# Patient Record
Sex: Female | Born: 1935 | Race: White | Hispanic: No | Smoking: Never smoker
Health system: Southern US, Community
[De-identification: ages and names within clinical notes are randomized; demographics above are authoritative.]

## PROBLEM LIST (undated history)

## (undated) DIAGNOSIS — K5790 Diverticulosis of intestine, part unspecified, without perforation or abscess without bleeding: Secondary | ICD-10-CM

## (undated) DIAGNOSIS — C801 Malignant (primary) neoplasm, unspecified: Secondary | ICD-10-CM

## (undated) DIAGNOSIS — J45909 Unspecified asthma, uncomplicated: Secondary | ICD-10-CM

## (undated) DIAGNOSIS — G43909 Migraine, unspecified, not intractable, without status migrainosus: Secondary | ICD-10-CM

## (undated) DIAGNOSIS — K589 Irritable bowel syndrome without diarrhea: Secondary | ICD-10-CM

## (undated) DIAGNOSIS — N319 Neuromuscular dysfunction of bladder, unspecified: Secondary | ICD-10-CM

## (undated) DIAGNOSIS — M1990 Unspecified osteoarthritis, unspecified site: Secondary | ICD-10-CM

## (undated) DIAGNOSIS — E785 Hyperlipidemia, unspecified: Secondary | ICD-10-CM

## (undated) DIAGNOSIS — I10 Essential (primary) hypertension: Secondary | ICD-10-CM

## (undated) DIAGNOSIS — I714 Abdominal aortic aneurysm, without rupture, unspecified: Secondary | ICD-10-CM

## (undated) DIAGNOSIS — I82409 Acute embolism and thrombosis of unspecified deep veins of unspecified lower extremity: Secondary | ICD-10-CM

## (undated) DIAGNOSIS — K219 Gastro-esophageal reflux disease without esophagitis: Secondary | ICD-10-CM

## (undated) HISTORY — PX: ABDOMINAL HYSTERECTOMY: SHX81

## (undated) HISTORY — PX: CHOLECYSTECTOMY: SHX55

## (undated) HISTORY — DX: Migraine, unspecified, not intractable, without status migrainosus: G43.909

## (undated) HISTORY — PX: BREAST SURGERY: SHX581

## (undated) HISTORY — DX: Neuromuscular dysfunction of bladder, unspecified: N31.9

## (undated) HISTORY — DX: Hyperlipidemia, unspecified: E78.5

## (undated) HISTORY — DX: Irritable bowel syndrome, unspecified: K58.9

## (undated) HISTORY — DX: Diverticulosis of intestine, part unspecified, without perforation or abscess without bleeding: K57.90

## (undated) HISTORY — DX: Abdominal aortic aneurysm, without rupture, unspecified: I71.40

## (undated) HISTORY — DX: Acute embolism and thrombosis of unspecified deep veins of unspecified lower extremity: I82.409

## (undated) HISTORY — PX: APPENDECTOMY: SHX54

---

## 1968-08-23 HISTORY — PX: BREAST EXCISIONAL BIOPSY: SUR124

## 1998-08-23 HISTORY — PX: BREAST LUMPECTOMY: SHX2

## 2004-07-06 ENCOUNTER — Ambulatory Visit: Payer: Self-pay | Admitting: Radiation Oncology

## 2004-09-23 ENCOUNTER — Ambulatory Visit: Payer: Self-pay

## 2004-09-24 ENCOUNTER — Ambulatory Visit: Payer: Self-pay

## 2004-12-16 ENCOUNTER — Ambulatory Visit: Payer: Self-pay | Admitting: Internal Medicine

## 2005-12-17 ENCOUNTER — Ambulatory Visit: Payer: Self-pay | Admitting: Internal Medicine

## 2006-04-26 ENCOUNTER — Inpatient Hospital Stay: Payer: Self-pay | Admitting: Internal Medicine

## 2006-06-22 ENCOUNTER — Ambulatory Visit: Payer: Self-pay | Admitting: Internal Medicine

## 2006-08-30 ENCOUNTER — Ambulatory Visit: Payer: Self-pay | Admitting: Internal Medicine

## 2006-12-20 ENCOUNTER — Ambulatory Visit: Payer: Self-pay | Admitting: Internal Medicine

## 2007-06-13 ENCOUNTER — Ambulatory Visit: Payer: Self-pay

## 2007-12-21 ENCOUNTER — Ambulatory Visit: Payer: Self-pay | Admitting: Internal Medicine

## 2008-02-05 ENCOUNTER — Observation Stay: Payer: Self-pay | Admitting: Internal Medicine

## 2008-02-07 ENCOUNTER — Ambulatory Visit: Payer: Self-pay | Admitting: Gastroenterology

## 2008-03-07 ENCOUNTER — Ambulatory Visit: Payer: Self-pay | Admitting: Gastroenterology

## 2008-04-02 ENCOUNTER — Ambulatory Visit: Payer: Self-pay | Admitting: Gastroenterology

## 2008-05-07 ENCOUNTER — Ambulatory Visit: Payer: Self-pay | Admitting: Internal Medicine

## 2008-05-29 ENCOUNTER — Ambulatory Visit: Payer: Self-pay | Admitting: Surgery

## 2008-09-03 ENCOUNTER — Ambulatory Visit: Payer: Self-pay | Admitting: Surgery

## 2008-10-09 ENCOUNTER — Inpatient Hospital Stay: Payer: Self-pay | Admitting: Surgery

## 2008-12-23 ENCOUNTER — Ambulatory Visit: Payer: Self-pay | Admitting: Internal Medicine

## 2009-04-15 ENCOUNTER — Ambulatory Visit: Payer: Self-pay | Admitting: Internal Medicine

## 2009-05-16 ENCOUNTER — Ambulatory Visit: Payer: Self-pay | Admitting: Internal Medicine

## 2009-07-24 ENCOUNTER — Ambulatory Visit: Payer: Self-pay | Admitting: Gastroenterology

## 2009-10-02 ENCOUNTER — Ambulatory Visit: Payer: Self-pay | Admitting: Cardiology

## 2009-11-06 ENCOUNTER — Ambulatory Visit: Payer: Self-pay | Admitting: Internal Medicine

## 2009-12-24 ENCOUNTER — Ambulatory Visit: Payer: Self-pay | Admitting: Internal Medicine

## 2010-06-24 ENCOUNTER — Ambulatory Visit: Payer: Self-pay | Admitting: Internal Medicine

## 2011-01-05 ENCOUNTER — Ambulatory Visit: Payer: Self-pay | Admitting: Internal Medicine

## 2011-12-13 ENCOUNTER — Ambulatory Visit: Payer: Self-pay | Admitting: Internal Medicine

## 2012-07-28 ENCOUNTER — Ambulatory Visit: Payer: Self-pay | Admitting: Vascular Surgery

## 2012-09-23 ENCOUNTER — Emergency Department: Payer: Self-pay | Admitting: Emergency Medicine

## 2012-11-10 ENCOUNTER — Ambulatory Visit: Payer: Self-pay | Admitting: Orthopedic Surgery

## 2013-03-22 ENCOUNTER — Ambulatory Visit: Payer: Self-pay | Admitting: Internal Medicine

## 2013-12-26 ENCOUNTER — Ambulatory Visit (INDEPENDENT_AMBULATORY_CARE_PROVIDER_SITE_OTHER): Payer: Medicare Other | Admitting: Podiatry

## 2013-12-26 ENCOUNTER — Encounter: Payer: Self-pay | Admitting: Podiatry

## 2013-12-26 ENCOUNTER — Ambulatory Visit (INDEPENDENT_AMBULATORY_CARE_PROVIDER_SITE_OTHER): Payer: Medicare Other

## 2013-12-26 VITALS — BP 121/56 | HR 73 | Resp 16 | Ht 62.0 in | Wt 142.0 lb

## 2013-12-26 DIAGNOSIS — M79673 Pain in unspecified foot: Secondary | ICD-10-CM

## 2013-12-26 DIAGNOSIS — M7751 Other enthesopathy of right foot: Secondary | ICD-10-CM

## 2013-12-26 DIAGNOSIS — M2040 Other hammer toe(s) (acquired), unspecified foot: Secondary | ICD-10-CM

## 2013-12-26 DIAGNOSIS — M79609 Pain in unspecified limb: Secondary | ICD-10-CM

## 2013-12-26 DIAGNOSIS — M7750 Other enthesopathy of unspecified foot: Secondary | ICD-10-CM

## 2013-12-26 NOTE — Progress Notes (Signed)
   Subjective:    Patient ID: Traci Quinn, female    DOB: [birth year], 80 y.o.   MRN: [id number]  HPI Comments: i have a corn on my 2nd toe and its tender. It swells up. i have had it for 3 months. Its about the same but some days its worse. i used a corn patch for 14 days.  Foot Pain Associated symptoms include abdominal pain and fatigue.      Review of Systems  Constitutional: Positive for fatigue.  Respiratory: Positive for wheezing.   Gastrointestinal: Positive for abdominal pain.  Hematological: Bruises/bleeds easily.  All other systems reviewed and are negative.      Objective:   Physical Exam: I have reviewed past medical history medications allergies surgeries social history and review of systems. Pulses are palpable bilateral foot. Neurologic sensorium is intact bilateral. Degenerative flexor intact bilateral. Orthopedic evaluation demonstrates hallux abductovalgus deformity right foot with hallux rubbing the second toe he is a hammertoe deformity. This hammertoe deformity. The arthritic in nature. She also has a area that is painful and swollen overlying the DIPJ medially with the area of reactive hyperkeratosis. Her margin of her toenail appears to be ingrown as well.        Assessment & Plan:  Assessment: Hammertoe deformity with bursitis and capsulitis. Mild paronychia second digit right foot.  Plan: Injected the second toe with dexamethasone and local anesthetic debridement of reactive hyperkeratosis. Also debrided the nail. Suggested she start soaking in Epsom salts warm water for mild paronychia and she has. I will followup with her as needed with and we did placed padding.

## 2014-02-23 DIAGNOSIS — E782 Mixed hyperlipidemia: Secondary | ICD-10-CM | POA: Insufficient documentation

## 2014-02-23 DIAGNOSIS — N1832 Chronic kidney disease, stage 3b: Secondary | ICD-10-CM | POA: Insufficient documentation

## 2014-02-23 DIAGNOSIS — J45909 Unspecified asthma, uncomplicated: Secondary | ICD-10-CM | POA: Insufficient documentation

## 2014-02-23 DIAGNOSIS — G8929 Other chronic pain: Secondary | ICD-10-CM | POA: Insufficient documentation

## 2014-02-23 DIAGNOSIS — G43909 Migraine, unspecified, not intractable, without status migrainosus: Secondary | ICD-10-CM | POA: Insufficient documentation

## 2014-02-23 DIAGNOSIS — K219 Gastro-esophageal reflux disease without esophagitis: Secondary | ICD-10-CM | POA: Insufficient documentation

## 2014-02-23 DIAGNOSIS — I728 Aneurysm of other specified arteries: Secondary | ICD-10-CM | POA: Insufficient documentation

## 2014-03-08 DIAGNOSIS — D510 Vitamin B12 deficiency anemia due to intrinsic factor deficiency: Secondary | ICD-10-CM | POA: Insufficient documentation

## 2014-04-04 ENCOUNTER — Ambulatory Visit: Payer: Self-pay | Admitting: Internal Medicine

## 2016-04-20 ENCOUNTER — Other Ambulatory Visit: Payer: Self-pay | Admitting: Gastroenterology

## 2016-04-20 DIAGNOSIS — R1310 Dysphagia, unspecified: Secondary | ICD-10-CM

## 2016-04-23 ENCOUNTER — Ambulatory Visit
Admission: RE | Admit: 2016-04-23 | Discharge: 2016-04-23 | Disposition: A | Discharge status: Home / Self Care | Payer: Medicare Other | Source: Ambulatory Visit | Attending: Gastroenterology | Admitting: Gastroenterology

## 2016-04-23 DIAGNOSIS — R1310 Dysphagia, unspecified: Secondary | ICD-10-CM | POA: Diagnosis present

## 2016-04-23 DIAGNOSIS — K219 Gastro-esophageal reflux disease without esophagitis: Secondary | ICD-10-CM | POA: Diagnosis not present

## 2016-04-23 DIAGNOSIS — K228 Other specified diseases of esophagus: Secondary | ICD-10-CM | POA: Insufficient documentation

## 2016-04-23 DIAGNOSIS — K222 Esophageal obstruction: Secondary | ICD-10-CM | POA: Diagnosis not present

## 2016-05-01 ENCOUNTER — Other Ambulatory Visit: Payer: Self-pay | Admitting: Gastroenterology

## 2016-05-01 DIAGNOSIS — Z800 Family history of malignant neoplasm of digestive organs: Secondary | ICD-10-CM

## 2016-05-12 ENCOUNTER — Ambulatory Visit
Admission: RE | Admit: 2016-05-12 | Discharge: 2016-05-12 | Disposition: A | Discharge status: Home / Self Care | Payer: Medicare Other | Source: Ambulatory Visit | Attending: Gastroenterology | Admitting: Gastroenterology

## 2016-05-12 DIAGNOSIS — Z800 Family history of malignant neoplasm of digestive organs: Secondary | ICD-10-CM

## 2016-08-06 ENCOUNTER — Ambulatory Visit: Payer: Medicare Other | Admitting: Anesthesiology

## 2016-08-06 ENCOUNTER — Encounter: Payer: Self-pay | Admitting: *Deleted

## 2016-08-06 ENCOUNTER — Encounter
Admission: RE | Disposition: A | Discharge status: Home / Self Care | Payer: Self-pay | Source: Ambulatory Visit | Attending: Gastroenterology

## 2016-08-06 ENCOUNTER — Ambulatory Visit
Admission: RE | Admit: 2016-08-06 | Discharge: 2016-08-06 | Disposition: A | Discharge status: Home / Self Care | Payer: Medicare Other | Source: Ambulatory Visit | Attending: Gastroenterology | Admitting: Gastroenterology

## 2016-08-06 DIAGNOSIS — R1310 Dysphagia, unspecified: Secondary | ICD-10-CM | POA: Diagnosis present

## 2016-08-06 DIAGNOSIS — Z853 Personal history of malignant neoplasm of breast: Secondary | ICD-10-CM | POA: Diagnosis not present

## 2016-08-06 DIAGNOSIS — K224 Dyskinesia of esophagus: Secondary | ICD-10-CM | POA: Insufficient documentation

## 2016-08-06 DIAGNOSIS — J45909 Unspecified asthma, uncomplicated: Secondary | ICD-10-CM | POA: Diagnosis not present

## 2016-08-06 DIAGNOSIS — Z7951 Long term (current) use of inhaled steroids: Secondary | ICD-10-CM | POA: Diagnosis not present

## 2016-08-06 DIAGNOSIS — K219 Gastro-esophageal reflux disease without esophagitis: Secondary | ICD-10-CM | POA: Insufficient documentation

## 2016-08-06 DIAGNOSIS — I10 Essential (primary) hypertension: Secondary | ICD-10-CM | POA: Insufficient documentation

## 2016-08-06 DIAGNOSIS — Z79899 Other long term (current) drug therapy: Secondary | ICD-10-CM | POA: Insufficient documentation

## 2016-08-06 DIAGNOSIS — Z7982 Long term (current) use of aspirin: Secondary | ICD-10-CM | POA: Diagnosis not present

## 2016-08-06 HISTORY — DX: Unspecified asthma, uncomplicated: J45.909

## 2016-08-06 HISTORY — PX: ESOPHAGOGASTRODUODENOSCOPY (EGD) WITH PROPOFOL: SHX5813

## 2016-08-06 HISTORY — DX: Unspecified osteoarthritis, unspecified site: M19.90

## 2016-08-06 HISTORY — DX: Gastro-esophageal reflux disease without esophagitis: K21.9

## 2016-08-06 HISTORY — DX: Essential (primary) hypertension: I10

## 2016-08-06 HISTORY — DX: Malignant (primary) neoplasm, unspecified: C80.1

## 2016-08-06 SURGERY — ESOPHAGOGASTRODUODENOSCOPY (EGD) WITH PROPOFOL
Anesthesia: General

## 2016-08-06 MED ORDER — SODIUM CHLORIDE 0.9 % IV SOLN
INTRAVENOUS | Status: DC
  Administered 2016-08-06: 1000 mL via INTRAVENOUS

## 2016-08-06 MED ORDER — PROPOFOL 500 MG/50ML IV EMUL
INTRAVENOUS | Status: DC | PRN
  Administered 2016-08-06: 125 ug/kg/min via INTRAVENOUS

## 2016-08-06 MED ORDER — SODIUM CHLORIDE 0.9 % IV SOLN: INTRAVENOUS | Status: DC

## 2016-08-06 MED ORDER — PROPOFOL 10 MG/ML IV BOLUS
INTRAVENOUS | Status: DC | PRN
  Administered 2016-08-06: 30 mg via INTRAVENOUS
  Administered 2016-08-06: 50 mg via INTRAVENOUS
  Administered 2016-08-06: 20 mg via INTRAVENOUS

## 2016-08-06 NOTE — Anesthesia Preprocedure Evaluation (Signed)
Anesthesia Evaluation  Patient identified by MRN, date of birth, ID band Patient awake    Reviewed: Allergy & Precautions, H&P , NPO status , Patient's Chart, lab work & pertinent test results, reviewed documented beta blocker date and time   History of Anesthesia Complications Negative for: history of anesthetic complications  Airway Mallampati: III  TM Distance: >3 FB Neck ROM: full    Dental no notable dental hx. (+) Edentulous Upper, Edentulous Lower, Upper Dentures, Lower Dentures   Pulmonary neg shortness of breath, asthma , neg sleep apnea, neg COPD, neg recent URI,    Pulmonary exam normal breath sounds clear to auscultation       Cardiovascular Exercise Tolerance: Good hypertension, On Medications (-) angina(-) CAD, (-) Past MI, (-) Cardiac Stents and (-) CABG Normal cardiovascular exam(-) dysrhythmias (-) Valvular Problems/Murmurs Rhythm:regular Rate:Normal     Neuro/Psych negative neurological ROS  negative psych ROS   GI/Hepatic Neg liver ROS, GERD  ,  Endo/Other  negative endocrine ROS  Renal/GU negative Renal ROS  negative genitourinary   Musculoskeletal   Abdominal   Peds  Hematology negative hematology ROS (+)   Anesthesia Other Findings Past Medical History: No date: Arthritis No date: Asthma No date: Cancer ([place])     Comment: left sided breast cancer No date: GERD (gastroesophageal reflux disease) No date: Hypertension   Reproductive/Obstetrics negative OB ROS                             Anesthesia Physical Anesthesia Plan  ASA: II  Anesthesia Plan: General   Post-op Pain Management:    Induction:   Airway Management Planned:   Additional Equipment:   Intra-op Plan:   Post-operative Plan:   Informed Consent: I have reviewed the patients History and Physical, chart, labs and discussed the procedure including the risks, benefits and alternatives for the  proposed anesthesia with the patient or authorized representative who has indicated his/her understanding and acceptance.   Dental Advisory Given  Plan Discussed with: Anesthesiologist, CRNA and Surgeon  Anesthesia Plan Comments:         Anesthesia Quick Evaluation

## 2016-08-06 NOTE — Op Note (Signed)
[facility] Gastroenterology Patient Name: Traci Quinn Procedure Date: 08/06/2016 9:51 AM MRN: [id number] Account #: [id number] Date of Birth: 1934-05-11 Admit Type: Outpatient Age: 82 Room: [facility] ENDO ROOM 3 Gender: Female Note Status: Finalized Procedure:            Upper GI endoscopy Indications:          Dysphagia Providers:            Lollie Sails, MD Referring MD:         Ocie Cornfield. Ouida Sills MD, MD (Referring MD) Medicines:            Monitored Anesthesia Care Complications:        No immediate complications. Procedure:            Pre-Anesthesia Assessment:                       - ASA Grade Assessment: III - A patient with severe                        systemic disease.                       After obtaining informed consent, the endoscope was                        passed under direct vision. Throughout the procedure,                        the patient's blood pressure, pulse, and oxygen                        saturations were monitored continuously. The Endoscope                        was introduced through the mouth, and advanced to the                        third part of duodenum. The patient tolerated the                        procedure well. Findings:      Abnormal motility was noted in the middle third of the esophagus and in       the lower third of the esophagus. The cricopharyngeus was normal. There       are extra peristaltic waves in the esophageal body. Tertiary peristaltic       waves are noted. The motility is variable with evidence of "corkscrew       and hypertensive contractions. There is a zone of apparent higher       pressure noted in the distal esophagus, consistant with the location of       the fundoplication. No evidence of a fixed stricture.      A medium amount of food (residue) was found in the gastric body. This       could not be removed by suction. No apparent lesions noted in the       stomach but full  evaluation could not be done due obscuration due to       food.      The examined duodenum was normal.      Food (residue) was found in the first  portion of the duodenum. Impression:           - Abnormal esophageal motility, consistent with                        presbyesophagus.                       - A medium amount of food (residue) in the stomach.                       - Normal examined duodenum.                       - Retained food in the duodenum.                       - No specimens collected. Recommendation:       - Discharge patient to home.                       - Continue present medications.                       - consider trial of reglan 5 mg 2-3 times daily Procedure Code(s):    --- Professional ---                       [phone], Esophagogastroduodenoscopy, flexible, transoral;                        diagnostic, including collection of specimen(s) by                        brushing or washing, when performed (separate procedure) Diagnosis Code(s):    --- Professional ---                       K22.4, Dyskinesia of esophagus                       R13.10, Dysphagia, unspecified CPT copyright 2016 American Medical Association. All rights reserved. The codes documented in this report are preliminary and upon coder review may  be revised to meet current compliance requirements. Lollie Sails, MD 08/06/2016 10:36:30 AM This report has been signed electronically. Number of Addenda: 0 Note Initiated On: 08/06/2016 9:51 AM      [facility]

## 2016-08-06 NOTE — Anesthesia Postprocedure Evaluation (Signed)
Anesthesia Post Note  Patient: Traci Quinn  Procedure(s) Performed: Procedure(s) (LRB): ESOPHAGOGASTRODUODENOSCOPY (EGD) WITH PROPOFOL (N/A)  Patient location during evaluation: Endoscopy Anesthesia Type: General Level of consciousness: awake and alert Pain management: pain level controlled Vital Signs Assessment: post-procedure vital signs reviewed and stable Respiratory status: spontaneous breathing, nonlabored ventilation, respiratory function stable and patient connected to nasal cannula oxygen Cardiovascular status: blood pressure returned to baseline and stable Postop Assessment: no signs of nausea or vomiting Anesthetic complications: no    Last Vitals:  Vitals:   08/06/16 1049 08/06/16 1059  BP: 114/63 131/65  Pulse: (!) 57 (!) 52  Resp: 17 13  Temp:      Last Pain:  Vitals:   08/06/16 1030  TempSrc: Tympanic                 Martha Clan

## 2016-08-06 NOTE — Transfer of Care (Signed)
Immediate Anesthesia Transfer of Care Note  Patient: Traci Quinn  Procedure(s) Performed: Procedure(s): ESOPHAGOGASTRODUODENOSCOPY (EGD) WITH PROPOFOL (N/A)  Patient Location: PACU  Anesthesia Type:General  Level of Consciousness: sedated  Airway & Oxygen Therapy: Patient Spontanous Breathing and Patient connected to nasal cannula oxygen  Post-op Assessment: Report given to RN and Post -op Vital signs reviewed and stable  Post vital signs: Reviewed and stable  Last Vitals:  Vitals:   08/06/16 1029 08/06/16 1030  BP: (!) 85/54 (!) 91/56  Pulse: (!) 59 (!) 58  Resp: 18 16  Temp: 36.1 C 36.1 C    Last Pain:  Vitals:   08/06/16 1030  TempSrc: Tympanic         Complications: No apparent anesthesia complications

## 2016-08-06 NOTE — H&P (Signed)
Outpatient short stay form Pre-procedure 08/06/2016 9:57 AM Lollie Sails MD  Primary Physician: Dr. Frazier Richards  Reason for visit:  EGD  History of present illness:  Patient is a 82 year old female presenting today as above. She has a history of a fundoplication several years ago, this was likely for repair of a large hiatal hernia. Since that time she has had chronic issues with dysphagia. A barium swallow done several months ago that indicated a broad area of where the fundoplication is as well as presbyesophagus.    Current Facility-Administered Medications:  .  0.9 %  sodium chloride infusion, , Intravenous, Continuous, Lollie Sails, MD, Last Rate: 20 mL/hr at 08/06/16 0834, 1,000 mL at 08/06/16 0834 .  0.9 %  sodium chloride infusion, , Intravenous, Continuous, Lollie Sails, MD  Prescriptions Prior to Admission  Medication Sig Dispense Refill Last Dose  . aspirin 81 MG tablet Take 81 mg by mouth daily.   Past Month at Unknown time  . propranolol ER (INDERAL LA) 120 MG 24 hr capsule    08/05/2016 at 2130  . amitriptyline (ELAVIL) 25 MG tablet    Taking  . budesonide-formoterol (SYMBICORT) 160-4.5 MCG/ACT inhaler Inhale 2 puffs into the lungs as needed.   Taking  . Cetirizine HCl (ZYRTEC PO) Take by mouth daily.   Taking  . fluticasone (VERAMYST) 27.5 MCG/SPRAY nasal spray Place 2 sprays into the nose as needed for rhinitis.   Taking  . losartan-hydrochlorothiazide (HYZAAR) 100-12.5 MG per tablet    Taking  . lovastatin (MEVACOR) 20 MG tablet    Taking  . pantoprazole (PROTONIX) 40 MG tablet Take 40 mg by mouth daily.   Taking  . traMADol (ULTRAM) 50 MG tablet    Taking  . [DISCONTINUED] zolpidem (AMBIEN) 5 MG tablet    Taking     Allergies  Allergen Reactions  . Celebrex [Celecoxib]   . Indomethacin Nausea And Vomiting  . Lipitor [Atorvastatin]   . Zetia [Ezetimibe]      Past Medical History:  Diagnosis Date  . Arthritis   . Asthma   . Cancer  [facility])    left sided breast cancer  . GERD (gastroesophageal reflux disease)   . Hypertension     Review of systems:      Physical Exam    Heart and lungs: Regular rate and rhythm without rub or gallop, lungs are bilaterally clear    HEENT: Normocephalic atraumatic eyes are anicteric     Regular rate and rhythm without rub or gallop, lungs are bilaterally clear.    Pertinant exam for procedure: Soft nontender nondistended bowel sounds positive normoactive.    Planned proceedures: EGD and indicated procedures. I had an extensive discussion with patient and her daughter in regards the findings on the barium swallow and implications in regards to a changes in the esophagus. I have discussed the risks benefits and complications of procedures to include not limited to bleeding, infection, perforation and the risk of sedation and the patient wishes to proceed.    Lollie Sails, MD Gastroenterology 08/06/2016  9:57 AM

## 2016-08-07 ENCOUNTER — Encounter: Payer: Self-pay | Admitting: Gastroenterology

## 2016-08-13 ENCOUNTER — Ambulatory Visit (INDEPENDENT_AMBULATORY_CARE_PROVIDER_SITE_OTHER): Payer: Self-pay | Admitting: Vascular Surgery

## 2016-08-13 ENCOUNTER — Encounter (INDEPENDENT_AMBULATORY_CARE_PROVIDER_SITE_OTHER): Payer: Self-pay

## 2016-08-20 ENCOUNTER — Other Ambulatory Visit (INDEPENDENT_AMBULATORY_CARE_PROVIDER_SITE_OTHER): Payer: Self-pay | Admitting: Vascular Surgery

## 2016-08-20 DIAGNOSIS — I728 Aneurysm of other specified arteries: Secondary | ICD-10-CM

## 2016-08-24 ENCOUNTER — Ambulatory Visit (INDEPENDENT_AMBULATORY_CARE_PROVIDER_SITE_OTHER): Payer: Medicare Other

## 2016-08-24 ENCOUNTER — Encounter (INDEPENDENT_AMBULATORY_CARE_PROVIDER_SITE_OTHER): Payer: Self-pay | Admitting: Vascular Surgery

## 2016-08-24 ENCOUNTER — Ambulatory Visit (INDEPENDENT_AMBULATORY_CARE_PROVIDER_SITE_OTHER): Payer: Medicare Other | Admitting: Vascular Surgery

## 2016-08-24 VITALS — BP 152/83 | HR 63 | Resp 16 | Ht 62.0 in | Wt 135.0 lb

## 2016-08-24 DIAGNOSIS — I728 Aneurysm of other specified arteries: Secondary | ICD-10-CM | POA: Diagnosis not present

## 2016-08-24 DIAGNOSIS — I10 Essential (primary) hypertension: Secondary | ICD-10-CM

## 2016-08-24 NOTE — Assessment & Plan Note (Signed)
blood pressure control important in reducing the progression of atherosclerotic disease and aneurysmal degeneration. On appropriate oral medications.  

## 2016-08-24 NOTE — Progress Notes (Signed)
MRN : [id number]  Traci Quinn is a 83 y.o. (1934/02/09) female who presents with chief complaint of  Chief Complaint  Patient presents with  . Follow-up  .  History of Present Illness: Patient returns today in follow up of Splenic artery aneurysm. The patient is doing well without specific complaints today. She denies abdominal pain, unintentional weight loss, or other vascular issues. Her duplex today shows a stable 0.9 cm splenic artery aneurysm without evidence of rupture.  Current Outpatient Prescriptions  Medication Sig Dispense Refill  . amitriptyline (ELAVIL) 25 MG tablet     . aspirin 81 MG tablet Take 81 mg by mouth daily.    . budesonide-formoterol (SYMBICORT) 160-4.5 MCG/ACT inhaler Inhale 2 puffs into the lungs as needed.    . Cetirizine HCl (ZYRTEC PO) Take by mouth daily.    . fluticasone (VERAMYST) 27.5 MCG/SPRAY nasal spray Place 2 sprays into the nose as needed for rhinitis.    Marland Kitchen losartan-hydrochlorothiazide (HYZAAR) 100-12.5 MG per tablet     . lovastatin (MEVACOR) 20 MG tablet     . omeprazole (PRILOSEC) 40 MG capsule Take by mouth.    . propranolol ER (INDERAL LA) 120 MG 24 hr capsule     . traMADol (ULTRAM) 50 MG tablet     . pantoprazole (PROTONIX) 40 MG tablet Take 40 mg by mouth daily.     No current facility-administered medications for this visit.     Past Medical History:  Diagnosis Date  . Arthritis   . Asthma   . Cancer [facility])    left sided breast cancer  . GERD (gastroesophageal reflux disease)   . Hypertension     Past Surgical History:  Procedure Laterality Date  . ABDOMINAL HYSTERECTOMY    . APPENDECTOMY    . BREAST SURGERY     lumpectomy  . CHOLECYSTECTOMY    . ESOPHAGOGASTRODUODENOSCOPY (EGD) WITH PROPOFOL N/A 08/06/2016   Procedure: ESOPHAGOGASTRODUODENOSCOPY (EGD) WITH PROPOFOL;  Surgeon: Lollie Sails, MD;  Location: [facility] ENDOSCOPY;  Service: Endoscopy;  Laterality: N/A;    Social History Social History  Substance  Use Topics  . Smoking status: Never Smoker  . Smokeless tobacco: Never Used  . Alcohol use No      Family History No bleeding or clotting disorders  Allergies  Allergen Reactions  . Celebrex [Celecoxib]   . Indomethacin Nausea And Vomiting  . Lipitor [Atorvastatin]   . Zetia [Ezetimibe]      REVIEW OF SYSTEMS (Negative unless checked)  Constitutional: [] Weight loss  [] Fever  [] Chills Cardiac: [] Chest pain   [] Chest pressure   [] Palpitations   [] Shortness of breath when laying flat   [] Shortness of breath at rest   [] Shortness of breath with exertion. Vascular:  [] Pain in legs with walking   [] Pain in legs at rest   [] Pain in legs when laying flat   [] Claudication   [] Pain in feet when walking  [] Pain in feet at rest  [] Pain in feet when laying flat   [] History of DVT   [] Phlebitis   [] Swelling in legs   [] Varicose veins   [] Non-healing ulcers Pulmonary:   [] Uses home oxygen   [] Productive cough   [] Hemoptysis   [] Wheeze  [] COPD   [] Asthma Neurologic:  [] Dizziness  [] Blackouts   [] Seizures   [] History of stroke   [] History of TIA  [] Aphasia   [] Temporary blindness   [] Dysphagia   [] Weakness or numbness in arms   [] Weakness or numbness in legs Musculoskeletal:  [] Arthritis   []   Joint swelling   [] Joint pain   [] Low back pain Hematologic:  [] Easy bruising  [] Easy bleeding   [] Hypercoagulable state   [] Anemic   Gastrointestinal:  [] Blood in stool   [] Vomiting blood  [] Gastroesophageal reflux/heartburn   [] Abdominal pain Genitourinary:  [] Chronic kidney disease   [] Difficult urination  [] Frequent urination  [] Burning with urination   [] Hematuria Skin:  [] Rashes   [] Ulcers   [] Wounds Psychological:  [] History of anxiety   []  History of major depression.  Physical Examination  BP (!) 152/83   Pulse 63   Resp 16   Ht 5\' 2"  (1.575 m)   Wt 135 lb (61.2 kg)   BMI 24.69 kg/m  Gen:  WD/WN, NAD. Appears younger than stated age Head: [place]/AT, No temporalis wasting. Ear/Nose/Throat: Hearing  grossly intact, nares w/o erythema or drainage, trachea midline Eyes: Conjunctiva clear. Sclera non-icteric Neck: Supple.  No JVD.  Pulmonary:  Good air movement, no use of accessory muscles.  Cardiac: RRR, normal S1, S2 Vascular:  Vessel Right Left  Radial Palpable Palpable                                   Gastrointestinal: soft, non-tender/non-distended. No guarding/reflex.  Musculoskeletal: M/S 5/5 throughout.  No deformity or atrophy.  Neurologic: Sensation grossly intact in extremities.  Symmetrical.  Speech is fluent.  Psychiatric: Judgment intact, Mood & affect appropriate for pt's clinical situation. Dermatologic: No rashes or ulcers noted.  No cellulitis or open wounds. Lymph : No Cervical, Axillary, or Inguinal lymphadenopathy.      Labs No results found for this or any previous visit (from the past 2160 hour(s)).  Radiology No results found.   Assessment/Plan  Essential hypertension, benign blood pressure control important in reducing the progression of atherosclerotic disease and aneurysmal degeneration. On appropriate oral medications.   Splenic artery aneurysm (HCC) Duplex today demonstrates a stable 0.9 cm splenic artery aneurysm without evidence of rupture. This really has not changed over the past 4-5 years. We will continue to follow this on an every other year basis with duplex unless problems develop in the interim.    Leotis Pain, MD  08/24/2016 10:22 AM    This note was created with Dragon medical transcription system.  Any errors from dictation are purely unintentional

## 2016-08-24 NOTE — Assessment & Plan Note (Signed)
Duplex today demonstrates a stable 0.9 cm splenic artery aneurysm without evidence of rupture. This really has not changed over the past 4-5 years. We will continue to follow this on an every other year basis with duplex unless problems develop in the interim.

## 2017-03-29 DIAGNOSIS — Z0000 Encounter for general adult medical examination without abnormal findings: Secondary | ICD-10-CM | POA: Insufficient documentation

## 2018-07-27 ENCOUNTER — Other Ambulatory Visit: Payer: Self-pay | Admitting: Gastroenterology

## 2018-07-27 DIAGNOSIS — R159 Full incontinence of feces: Secondary | ICD-10-CM

## 2018-07-27 DIAGNOSIS — K529 Noninfective gastroenteritis and colitis, unspecified: Secondary | ICD-10-CM

## 2018-08-04 ENCOUNTER — Ambulatory Visit
Admission: RE | Admit: 2018-08-04 | Discharge: 2018-08-04 | Disposition: A | Discharge status: Home / Self Care | Payer: Medicare Other | Source: Ambulatory Visit | Attending: Gastroenterology | Admitting: Gastroenterology

## 2018-08-04 DIAGNOSIS — K529 Noninfective gastroenteritis and colitis, unspecified: Secondary | ICD-10-CM | POA: Diagnosis not present

## 2018-08-04 DIAGNOSIS — R159 Full incontinence of feces: Secondary | ICD-10-CM | POA: Insufficient documentation

## 2019-02-02 DIAGNOSIS — I7300 Raynaud's syndrome without gangrene: Secondary | ICD-10-CM | POA: Insufficient documentation

## 2019-03-29 ENCOUNTER — Other Ambulatory Visit: Payer: Self-pay | Admitting: Internal Medicine

## 2019-03-29 DIAGNOSIS — N644 Mastodynia: Secondary | ICD-10-CM

## 2019-03-29 DIAGNOSIS — Z859 Personal history of malignant neoplasm, unspecified: Secondary | ICD-10-CM

## 2019-04-09 ENCOUNTER — Ambulatory Visit
Admission: RE | Admit: 2019-04-09 | Discharge: 2019-04-09 | Disposition: A | Discharge status: Home / Self Care | Payer: Medicare HMO | Source: Ambulatory Visit | Attending: Internal Medicine | Admitting: Internal Medicine

## 2019-04-09 ENCOUNTER — Other Ambulatory Visit: Payer: Self-pay

## 2019-04-09 DIAGNOSIS — Z859 Personal history of malignant neoplasm, unspecified: Secondary | ICD-10-CM

## 2019-04-09 DIAGNOSIS — N644 Mastodynia: Secondary | ICD-10-CM | POA: Insufficient documentation

## 2020-02-13 ENCOUNTER — Telehealth: Payer: Self-pay | Admitting: Physical Therapy

## 2020-02-18 ENCOUNTER — Ambulatory Visit: Payer: Medicare HMO | Attending: Sports Medicine | Admitting: Physical Therapy

## 2020-02-18 ENCOUNTER — Encounter: Payer: Self-pay | Admitting: Physical Therapy

## 2020-02-18 ENCOUNTER — Other Ambulatory Visit: Payer: Self-pay

## 2020-02-18 DIAGNOSIS — M545 Low back pain, unspecified: Secondary | ICD-10-CM

## 2020-02-18 DIAGNOSIS — M533 Sacrococcygeal disorders, not elsewhere classified: Secondary | ICD-10-CM | POA: Insufficient documentation

## 2020-02-18 DIAGNOSIS — M25551 Pain in right hip: Secondary | ICD-10-CM | POA: Insufficient documentation

## 2020-02-18 DIAGNOSIS — G8929 Other chronic pain: Secondary | ICD-10-CM | POA: Diagnosis present

## 2020-02-18 NOTE — Therapy (Signed)
[place] [facility] [address], [place], [address] Phone: [phone]   Fax:  [phone]  Physical Therapy Evaluation  Patient Details  Name: Traci Quinn MRN: [id number] Date of Birth: 1933-12-29 No data recorded  Encounter Date: 02/18/2020   PT End of Session - 02/18/20 1252    Visit Number 1    Number of Visits 16    Date for PT Re-Evaluation 04/14/20    PT Start Time 0900    PT Stop Time 1000    PT Time Calculation (min) 60 min    Activity Tolerance Patient limited by pain;Patient tolerated treatment well    Behavior During Therapy [facility] for tasks assessed/performed           Past Medical History:  Diagnosis Date  . Arthritis   . Asthma   . Cancer [facility])    left sided breast cancer  . GERD (gastroesophageal reflux disease)   . Hypertension     Past Surgical History:  Procedure Laterality Date  . ABDOMINAL HYSTERECTOMY    . APPENDECTOMY    . BREAST EXCISIONAL BIOPSY Right 1970   neg surgical exc  . BREAST LUMPECTOMY Left 2000   rad and no chemo  . BREAST SURGERY     lumpectomy  . CHOLECYSTECTOMY    . ESOPHAGOGASTRODUODENOSCOPY (EGD) WITH PROPOFOL N/A 08/06/2016   Procedure: ESOPHAGOGASTRODUODENOSCOPY (EGD) WITH PROPOFOL;  Surgeon: Lollie Sails, MD;  Location: [facility] ENDOSCOPY;  Service: Endoscopy;  Laterality: N/A;    There were no vitals filed for this visit.    Subjective Assessment - 02/18/20 1146    Pertinent History Pt is an 86 y.o female presenting to PT with c/o of R hip and low back pain 4 weeks s/p fall onto R posterolateral hip.  Pt reports she lost her balance while picking up something off her couch and rotating to place it on a table.  Pt reports this is the only fall she has had in the past 6 mo.  Pt reports aching pain with occasional sharp pain in R hip and R low back (Worst: 8/10, Best: 0/10, Current 4/10) with pain radiating into anterior thigh to anterolateral knee.   Pain is worse in the mornings, gets better as the day goes on, and is aggravated by lifting the R leg, standing from a seated position, stairs, and walking.  Pt reports no previous hx of falls, trauma, or hx of back/hip injury or pain.  Pt reports she had sx of sciatica isolated to the R low back prior to her fall with no prior tx.  Prior to injury, pt amb using a SPC on outdoor, uneven surfaces only; now is using the [facility] any time she leaves her home.  Pt lives in a single story home with her 63 y.o. great grandson of whom she is the primary caregiver; pt reports her daughter also lives with her but she works and is only home in the late evening.  Pt has a ramp entry that she uses, and avoids using other entrances in her home with stairs d/t difficulty with ascending/descending stairs.  Pt also reports difficulty with completing household chores d/t hip pain.  Pt goals with therapy are to be able to lift RLE and reduce pain.  Pt reported PMH of breast cancer that is in remission, HTN, and asthma.  Pt denies N/V, B&B changes, unexplained weight fluctuation, saddle paresthesia, fever, night sweats, or unrelenting night pain at this time.  Limitations Sitting;Lifting;Standing;Walking;House hold activities    How long can you sit comfortably? 15 min    How long can you stand comfortably? 10 min    How long can you walk comfortably? 1-2 min    Patient Stated Goals To be able to lift RLE and reduce hip/back pain    Currently in Pain? Yes    Pain Score 4     Pain Location Hip    Pain Orientation Right;Posterior    Pain Descriptors / Indicators Sharp;Aching;Guarding;Discomfort    Pain Type Chronic pain    Pain Radiating Towards Anterior thigh, anterolateral knee    Pain Onset 1 to 4 weeks ago    Pain Frequency Intermittent    Aggravating Factors  raising R leg, standing up from sitting, stairs, walking    Pain Relieving Factors rest, laying down    Effect of Pain on Daily Activities Worse in the morning,  interferes with ADLs, household chores, caregiver responsibilities, ambulatory level    Multiple Pain Sites Yes    Pain Location Back    Pain Orientation Right           OBJECTIVE  Mental Status Patient is oriented to person, place and time.  Recent memory is intact.  Remote memory is intact.  Attention span and concentration are intact.  Expressive speech is intact.  Patient's fund of knowledge is within normal limits for educational level.  SENSATION: Seems to be intact per pt report. Proprioception and hot/cold testing deferred on this date   MUSCULOSKELETAL: Tremor: None Bulk: Normal Tone: Normal No visible step-off along spinal column  Posture Standing posture: R lumbar lateral shift, L iliac crest higher than R, forward flexed posture with slight hip and knee flexion in static standing, positive for lower crossed syndrome Seated posture: R lumbar lateral shift, forward head and rounded shoulders, hypolordosis and sacral sitting  Gait Reciprocal gait pattern with SPC on R for balance, R lumbar lateral shift, minimal hip extension, decreased step length B   Palpation Palpation to R lumbar paraspinals reveals tightness and concordant back pain.  Palpation to superior glute max/med reveals tenderness. No pain with palpation of sacrum, medial glute max, ischial tuberosity or proximal hamstrings.   Strength (out of 5) R/L 4*/4+ Hip flexion 3*/4 Hip ER 4+/4+ Hip IR 3*/4+ Hip abduction 3/4 Hip adduction 3-*/3- Hip extension with difficulty d/t ROM limitations 3*/4 Knee extension 4+*/5 Knee flexion 5/5 Ankle dorsiflexion 5/5 Ankle plantarflexion  *Indicates pain   AROM (degrees) R/L (all movements include overpressure unless otherwise stated) Lumbar: grossly hypomobile and painful throughout  Lumbar forward flexion: 25% limited* Lumbar extension: near 100% limited* Lumbar lateral flexion (25): R: unlimited* L: 25% limited* Hip IR: WNL B Hip ER: R: 25d* L:  35d Hip Flexion: R: WNL*, L: WNL Hip Abduction: WNL B Hip extension: 0d B *Indicates pain  PROM (degrees) PROM = AROM Pain with R hip flexion PROM that radiates into anterior thigh and anterolateral knee   Passive Accessory Intervertebral Motion (PAIVM) Pt denies reproduction of back pain with CPA L1-L5 and UPA bilaterally L1-L5. Generally hypomobile throughout    SPECIAL TESTS Sign of Buttock: R: Positive L: Negative ASLR: R: Positive, painful L: Negative  Hip: FABER (SN 81): R: Positive L: Positive FADIR (SN 94): R: Positive L: Negative  SIJ:  Thigh Thrust: R: Positive L: Negative Gaenslen's: R: Positive, not formally tested d/t pain with hip flexion L: Negative Distraction: Positive Compression: Negative Sacral thrust: Negative  Piriformis Syndrome: FAIR Test: R:  unable to test d/t pain, L: Negative  Functional Tasks Sit to stand: uses BUE for initiation with BLE on back of chair for assistance  5xSTS: 45 sec TUG: 24.7 sec with SPC on R   TherEx PT reviewed the following HEP with patient with patient able to demonstrate a set of the following with min cuing for correction needed. PT educated patient on parameters of therex (how/when to inc/decrease intensity, frequency, rep/set range, stretch hold time, and purpose of therex) with verbalized understanding.  Supine posterior pelvic tilt x10 with 3 second hold with cueing to activate TrA Seated table walk out stretch with 30 second hold    Objective measurements completed on examination: See above findings.         PT Education - 02/18/20 1249    Education Details Patient was educated on diagnosis, anatomy and pathology involved, prognosis, role of PT, and was given an HEP, demonstrating exercise with proper form following verbal and tactile cues, and was given a paper hand out to continue exercise at home. Pt was educated on and agreed to plan of care.    Person(s) Educated Patient    Methods  Explanation;Demonstration;Tactile cues;Verbal cues;Handout    Comprehension Verbalized understanding;Returned demonstration;Verbal cues required;Tactile cues required;Need further instruction            PT Short Term Goals - 02/18/20 1254      PT SHORT TERM GOAL #1   Title Pt will be independent with HEP in order to improve strength and balance in order to decrease fall risk and improve function at home and work.    Baseline 02/18/20 pt educated on HEP and given handout    Time 2    Period Weeks    Status New    Target Date 03/03/20             PT Long Term Goals - 02/18/20 1304      PT LONG TERM GOAL #1   Title Pt will increase FOTO score to 59 to indicate increased functional mobility for ADLs.    Baseline 02/18/20 FOTO 41    Time 8    Period Weeks    Status New    Target Date 04/14/20      PT LONG TERM GOAL #2   Title Pt will decrease worst hip pain as reported on NPRS by at least 2 points in order to demonstrate clinically significant reduction in hip pain.    Baseline 02/18/20 Worst 8/10    Time 8    Period Weeks    Status New    Target Date 04/14/20      PT LONG TERM GOAL #3   Title Pt will decrease TUG to below 14 seconds in order to demonstrate decreased fall risk.    Baseline 02/18/20 TUG 24.7 sec    Time 8    Period Weeks    Status New    Target Date 04/14/20      PT LONG TERM GOAL #4   Title Pt will decrease 5xSTS to 15 seconds or less to match pt population age norms indicating increased LE strength and decreased fall risk.    Baseline 02/18/20 5xSTS 45 sec    Time 8    Period Weeks    Status New    Target Date 04/14/20      PT LONG TERM GOAL #5   Title Pt will ambulate with gait speed of 1.57m/s to demonstrate safe community ambulation.    Baseline 02/18/20 >1.77m/s  not formally assessed, to be assessed next session    Time 8    Period Weeks    Status New    Target Date 04/14/20                  Plan - 02/18/20 1434    Clinical  Impression Statement Pt is a pleasant 86 y.o. female referred for R hip pain.  PT examination reveals signs and sx consistent with R SIJ dysfunction; however, extremely guarded throughout examination d/t increased R hip pain with all motion.  Pt presents with deficits in pain, hip/core strength, hip/lumbar mobility, ROM, posture, and balance.  Activity limitations with bending forward to retrieve objects, independent ambulation, squatting, sit to stand transfers; inhibiting full independent and safe ADL management.  Pt will benefit from skilled PT services to address deficits and return to pain-free function with ADLs.    Personal Factors and Comorbidities Age;Comorbidity 1    Comorbidities HTN    Examination-Activity Limitations Bathing;Bed Mobility;Bend;Caring for Others;Carry;Dressing;Lift;Locomotion Level;Sit;Squat;Stairs;Stand;Toileting;Transfers    Examination-Participation Restrictions Cleaning;Laundry;Community Activity    Stability/Clinical Decision Making Evolving/Moderate complexity    Clinical Decision Making Moderate    Rehab Potential Good    PT Frequency 2x / week    PT Duration 8 weeks    PT Treatment/Interventions ADLs/Self Care Home Management;Electrical Stimulation;Therapeutic activities;Patient/family education;Energy conservation;Therapeutic exercise;Spinal Manipulations;Joint Manipulations;Biofeedback;Cryotherapy;Moist Heat;Traction;Gait training;Stair training;Functional mobility training;Neuromuscular re-education;Balance training;Manual techniques;Passive range of motion;Taping;Dry needling;DME Instruction    PT Next Visit Plan Gait training with SPC, TrA activation, postural training    PT Home Exercise Plan Posterior pevlic tilts, seated walk out stretch on table    Consulted and Agree with Plan of Care Patient           Patient will benefit from skilled therapeutic intervention in order to improve the following deficits and impairments:  Abnormal gait, Improper body  mechanics, Pain, Decreased coordination, Decreased mobility, Decreased activity tolerance, Decreased endurance, Decreased range of motion, Decreased strength, Hypomobility, Decreased balance, Difficulty walking, Impaired flexibility  Visit Diagnosis: Pain in right hip  Chronic right-sided low back pain, unspecified whether sciatica present  Sacroiliac dysfunction     Problem List Patient Active Problem List   Diagnosis Date Noted  . Essential hypertension, benign 08/24/2016  . Splenic artery aneurysm ([place]) 08/24/2016    Durwin Reges DPT  Chinita Greenland, SPT Durwin Reges 02/18/2020, 5:40 PM  [place] [facility] [address] [place], [address] Phone: [phone]   Fax:  [phone]  Name: Traci Quinn MRN: [id number] Date of Birth: 07-Oct-1933

## 2020-02-20 ENCOUNTER — Other Ambulatory Visit: Payer: Self-pay

## 2020-02-20 ENCOUNTER — Encounter: Payer: Self-pay | Admitting: Physical Therapy

## 2020-02-20 ENCOUNTER — Ambulatory Visit: Payer: Medicare HMO | Admitting: Physical Therapy

## 2020-02-20 DIAGNOSIS — G8929 Other chronic pain: Secondary | ICD-10-CM

## 2020-02-20 DIAGNOSIS — M533 Sacrococcygeal disorders, not elsewhere classified: Secondary | ICD-10-CM

## 2020-02-20 DIAGNOSIS — M25551 Pain in right hip: Secondary | ICD-10-CM | POA: Diagnosis not present

## 2020-02-20 NOTE — Therapy (Signed)
[place] [facility] [address], [place], [address] Phone: [phone]   Fax:  [phone]  Physical Therapy Treatment  Patient Details  Name: Traci Quinn MRN: [id number] Date of Birth: 1933-10-08 No data recorded  Encounter Date: 02/20/2020   PT End of Session - 02/20/20 1505    Visit Number 2    Number of Visits 16    Date for PT Re-Evaluation 04/14/20    PT Start Time 6962    PT Stop Time 1430    PT Time Calculation (min) 45 min    Activity Tolerance Patient tolerated treatment well;No increased pain    Behavior During Therapy WFL for tasks assessed/performed           Past Medical History:  Diagnosis Date  . Arthritis   . Asthma   . Cancer [facility])    left sided breast cancer  . GERD (gastroesophageal reflux disease)   . Hypertension     Past Surgical History:  Procedure Laterality Date  . ABDOMINAL HYSTERECTOMY    . APPENDECTOMY    . BREAST EXCISIONAL BIOPSY Right 1970   neg surgical exc  . BREAST LUMPECTOMY Left 2000   rad and no chemo  . BREAST SURGERY     lumpectomy  . CHOLECYSTECTOMY    . ESOPHAGOGASTRODUODENOSCOPY (EGD) WITH PROPOFOL N/A 08/06/2016   Procedure: ESOPHAGOGASTRODUODENOSCOPY (EGD) WITH PROPOFOL;  Surgeon: Lollie Sails, MD;  [facility] ENDOSCOPY;  Service: Endoscopy;  Laterality: N/A;    There were no vitals filed for this visit.   Subjective Assessment - 02/20/20 1352    Subjective Pt reports 4/10 pain in R hip today.  Pt reports compliance with HEP.    Pertinent History Pt is an 86 y.o female presenting to PT with c/o of R hip and low back pain 4 weeks s/p fall onto R posterolateral hip.  Pt reports she lost her balance while picking up something off her couch and rotating to place it on a table.  Pt reports this is the only fall she has had in the past 6 mo.  Pt reports aching pain with occasional sharp pain in R hip and R low back (Worst: 8/10, Best: 0/10,  Current 4/10) with pain radiating into anterior thigh to anterolateral knee.  Pain is worse in the mornings, gets better as the day goes on, and is aggravated by lifting the R leg, standing from a seated position, stairs, and walking.  Pt reports no previous hx of falls, trauma, or hx of back/hip injury or pain.  Pt reports she had sx of sciatica isolated to the R low back prior to her fall with no prior tx.  Prior to injury, pt amb using a SPC on outdoor, uneven surfaces only; now is using the [facility] any time she leaves her home.  Pt lives in a single story home with her 44 y.o. great grandson of whom she is the primary caregiver; pt reports her daughter also lives with her but she works and is only home in the late evening.  Pt has a ramp entry that she uses, and avoids using other entrances in her home with stairs d/t difficulty with ascending/descending stairs.  Pt also reports difficulty with completing household chores d/t hip pain.  Pt goals with therapy are to be able to lift RLE and reduce pain.  Pt reported PMH of breast cancer that is in remission, HTN, and asthma.  Pt denies N/V, B&B changes,  unexplained weight fluctuation, saddle paresthesia, fever, night sweats, or unrelenting night pain at this time.    Limitations Sitting;Lifting;Standing;Walking;House hold activities    How long can you sit comfortably? 15 min    How long can you stand comfortably? 10 min    How long can you walk comfortably? 1-2 min    Patient Stated Goals To be able to lift RLE and reduce hip/back pain    Currently in Pain? Yes    Pain Score 4     Pain Location Hip    Pain Orientation Right;Posterior    Pain Onset 1 to 4 weeks ago           Manual Therapy -STM to superior medial glute max just lateral to sacrum, and glute max/med along ilium for TrP release -Hooklying lumbar traction on tball 4x30sec  TherEx -Posterior pelvic tilt with cueing for TrA activation and appropriate body mechanics for HEP x10 -Glute  bridge 2x10 with cueing for TrA and glute activation -Table walkout stretch with cueing for proper positioning to produce stretch for HEP x30 sec  Gait Training -Amb 171ft with cueing for SPC in LUE to reduce R lumbar lateral leaning and promote proper WB distribution between LUE and RLE; pt with improved posture with gait, reduced R lumbar lateral leaning, and reports reduced pressure in R hip with amb; needs further instruction to promote comfort with new gait mechanics with AD    PT Education - 02/20/20 1354    Education Details Therex form/ mechanics    Person(s) Educated Patient    Methods Explanation;Demonstration;Verbal cues;Tactile cues    Comprehension Returned demonstration;Verbalized understanding;Verbal cues required            PT Short Term Goals - 02/18/20 1254      PT SHORT TERM GOAL #1   Title Pt will be independent with HEP in order to improve strength and balance in order to decrease fall risk and improve function at home and work.    Baseline 02/18/20 pt educated on HEP and given handout    Time 2    Period Weeks    Status New    Target Date 03/03/20             PT Long Term Goals - 02/18/20 1304      PT LONG TERM GOAL #1   Title Pt will increase FOTO score to 59 to indicate increased functional mobility for ADLs.    Baseline 02/18/20 FOTO 41    Time 8    Period Weeks    Status New    Target Date 04/14/20      PT LONG TERM GOAL #2   Title Pt will decrease worst hip pain as reported on NPRS by at least 2 points in order to demonstrate clinically significant reduction in hip pain.    Baseline 02/18/20 Worst 8/10    Time 8    Period Weeks    Status New    Target Date 04/14/20      PT LONG TERM GOAL #3   Title Pt will decrease TUG to below 14 seconds in order to demonstrate decreased fall risk.    Baseline 02/18/20 TUG 24.7 sec    Time 8    Period Weeks    Status New    Target Date 04/14/20      PT LONG TERM GOAL #4   Title Pt will decrease 5xSTS  to 15 seconds or less to match pt population age norms indicating increased  LE strength and decreased fall risk.    Baseline 02/18/20 5xSTS 45 sec    Time 8    Period Weeks    Status New    Target Date 04/14/20      PT LONG TERM GOAL #5   Title Pt will ambulate with gait speed of 1.58m/s to demonstrate safe community ambulation.    Baseline 02/18/20 >1.14m/s not formally assessed, to be assessed next session    Time 8    Period Weeks    Status New    Target Date 04/14/20                 Plan - 02/20/20 1452    Clinical Impression Statement PT focused on hip strengthening, pain reduction and gait mechanics.  Pt tolerated TrP release STM to medial and superior glute max/med with increased tolerance to therex directly after with no increase in R hip pain and reduced guarding.  Pt educated on appropriate use of SPC to promote appropriate WB distribution to decrease pressure on R hip and reduce R lumbar lateral leaning compensation with good carry over of cueing.  PT will continue progression as able.    Personal Factors and Comorbidities Age;Comorbidity 1    Comorbidities HTN    Examination-Activity Limitations Bathing;Bed Mobility;Bend;Caring for Others;Carry;Dressing;Lift;Locomotion Level;Sit;Squat;Stairs;Stand;Toileting;Transfers    Examination-Participation Restrictions Cleaning;Laundry;Community Activity    Stability/Clinical Decision Making Evolving/Moderate complexity    Clinical Decision Making Moderate    Rehab Potential Good    PT Frequency 2x / week    PT Duration 8 weeks    PT Treatment/Interventions ADLs/Self Care Home Management;Electrical Stimulation;Therapeutic activities;Patient/family education;Energy conservation;Therapeutic exercise;Spinal Manipulations;Joint Manipulations;Biofeedback;Cryotherapy;Moist Heat;Traction;Gait training;Stair training;Functional mobility training;Neuromuscular re-education;Balance training;Manual techniques;Passive range of motion;Taping;Dry  needling;DME Instruction    PT Next Visit Plan Gait training with SPC, core/glute strengthening    PT Home Exercise Plan Posterior pevlic tilts, seated walk out stretch on table    Consulted and Agree with Plan of Care Patient           Patient will benefit from skilled therapeutic intervention in order to improve the following deficits and impairments:  Abnormal gait, Improper body mechanics, Pain, Decreased coordination, Decreased mobility, Decreased activity tolerance, Decreased endurance, Decreased range of motion, Decreased strength, Hypomobility, Decreased balance, Difficulty walking, Impaired flexibility  Visit Diagnosis: Pain in right hip  Chronic right-sided low back pain, unspecified whether sciatica present  Sacroiliac dysfunction     Problem List Patient Active Problem List   Diagnosis Date Noted  . Essential hypertension, benign 08/24/2016  . Splenic artery aneurysm ([place]) 08/24/2016    Durwin Reges DPT Chinita Greenland, SPT Durwin Reges 02/20/2020, 4:12 PM  [place] [facility] [address], [place], [address] Phone: [phone]   Fax:  [phone]  Name: TAMBERLY POMPLUN MRN: [id number] Date of Birth: Jan 02, 1934

## 2020-02-27 ENCOUNTER — Encounter: Payer: Self-pay | Admitting: Physical Therapy

## 2020-02-27 ENCOUNTER — Ambulatory Visit: Payer: Medicare HMO | Attending: Sports Medicine | Admitting: Physical Therapy

## 2020-02-27 ENCOUNTER — Other Ambulatory Visit: Payer: Self-pay

## 2020-02-27 DIAGNOSIS — M533 Sacrococcygeal disorders, not elsewhere classified: Secondary | ICD-10-CM | POA: Insufficient documentation

## 2020-02-27 DIAGNOSIS — M545 Low back pain, unspecified: Secondary | ICD-10-CM

## 2020-02-27 DIAGNOSIS — M25551 Pain in right hip: Secondary | ICD-10-CM | POA: Insufficient documentation

## 2020-02-27 DIAGNOSIS — G8929 Other chronic pain: Secondary | ICD-10-CM | POA: Diagnosis present

## 2020-02-27 NOTE — Therapy (Signed)
[facility] [address] [place], [address] Phone: [phone]   Fax:  [phone]  Physical Therapy Treatment  Patient Details  Name: Traci Quinn MRN: [id number] Date of Birth: 02-29-1934 No data recorded  Encounter Date: 02/27/2020   PT End of Session - 02/27/20 0955    Visit Number 3    Number of Visits 16    Date for PT Re-Evaluation 04/14/20    PT Start Time 0900    PT Stop Time 0945    PT Time Calculation (min) 45 min    Activity Tolerance Patient tolerated treatment well;No increased pain    Behavior During Therapy WFL for tasks assessed/performed           Past Medical History:  Diagnosis Date  . Arthritis   . Asthma   . Cancer [facility])    left sided breast cancer  . GERD (gastroesophageal reflux disease)   . Hypertension     Past Surgical History:  Procedure Laterality Date  . ABDOMINAL HYSTERECTOMY    . APPENDECTOMY    . BREAST EXCISIONAL BIOPSY Right 1970   neg surgical exc  . BREAST LUMPECTOMY Left 2000   rad and no chemo  . BREAST SURGERY     lumpectomy  . CHOLECYSTECTOMY    . ESOPHAGOGASTRODUODENOSCOPY (EGD) WITH PROPOFOL N/A 08/06/2016   Procedure: ESOPHAGOGASTRODUODENOSCOPY (EGD) WITH PROPOFOL;  Surgeon: Lollie Sails, MD;  Location: [facility] ENDOSCOPY;  Service: Endoscopy;  Laterality: N/A;    There were no vitals filed for this visit.   Subjective Assessment - 02/27/20 0902    Subjective Pt reports 5/10 pain today in R hip.  Pt reports complianace with HEP.    Pertinent History Pt is an 86 y.o female presenting to PT with c/o of R hip and low back pain 4 weeks s/p fall onto R posterolateral hip.  Pt reports she lost her balance while picking up something off her couch and rotating to place it on a table.  Pt reports this is the only fall she has had in the past 6 mo.  Pt reports aching pain with occasional sharp pain in R hip and R low back (Worst: 8/10, Best: 0/10,  Current 4/10) with pain radiating into anterior thigh to anterolateral knee.  Pain is worse in the mornings, gets better as the day goes on, and is aggravated by lifting the R leg, standing from a seated position, stairs, and walking.  Pt reports no previous hx of falls, trauma, or hx of back/hip injury or pain.  Pt reports she had sx of sciatica isolated to the R low back prior to her fall with no prior tx.  Prior to injury, pt amb using a SPC on outdoor, uneven surfaces only; now is using the [facility] any time she leaves her home.  Pt lives in a single story home with her 55 y.o. great grandson of whom she is the primary caregiver; pt reports her daughter also lives with her but she works and is only home in the late evening.  Pt has a ramp entry that she uses, and avoids using other entrances in her home with stairs d/t difficulty with ascending/descending stairs.  Pt also reports difficulty with completing household chores d/t hip pain.  Pt goals with therapy are to be able to lift RLE and reduce pain.  Pt reported PMH of breast cancer that is in remission, HTN, and asthma.  Pt denies N/V, B&B changes,  unexplained weight fluctuation, saddle paresthesia, fever, night sweats, or unrelenting night pain at this time.    Limitations Sitting;Lifting;Standing;Walking;House hold activities    How long can you sit comfortably? 15 min    How long can you stand comfortably? 10 min    How long can you walk comfortably? 1-2 min    Patient Stated Goals To be able to lift RLE and reduce hip/back pain    Currently in Pain? Yes    Pain Score 5     Pain Location Hip    Pain Orientation Right    Pain Onset 1 to 4 weeks ago           Manual Therapy -STM to superior medial glute max just lateral to sacrum, and glute max/med along ilium for TrP release -Hooklying lumbar traction on tball 4x30sec   TherEx -Glute bridge 3x8 with cueing for TrA and glute activation -Sidelying hip abduction with 25% PT assistance 3x8 with  cueing for neutral hip alignment and hip extension to isolate glute activation and limit hip flexion compensation; pain noted in anterior thigh and knee, reduced with reduction of hip flexion compensation -Sit to stands from elevated mat table 2x8 with cueing to reduce knee valgus and activate glute -Seated marching 2x10 with cueing to perform R hip flexion in reduced pain-free range, cueing to reduce posterior leaning and needs further instruction -Seated hip abduction with YTB 2x10 with cueing for equal range on BLE -Seated knee extension with YTB 2x10 with cueing to reduce posterior leaning and perform in pain-free range   Gait Training -Amb 174ft and down 62ft incline to parking lot with cueing for SPC in LUE with improved posture with gait, reduced R lumbar lateral leaning, and reports reduced pressure in R hip with amb    PT Education - 02/27/20 0904    Education Details Therex form/ mechanics    Person(s) Educated Patient    Methods Explanation;Demonstration;Verbal cues    Comprehension Verbalized understanding;Returned demonstration            PT Short Term Goals - 02/18/20 1254      PT SHORT TERM GOAL #1   Title Pt will be independent with HEP in order to improve strength and balance in order to decrease fall risk and improve function at home and work.    Baseline 02/18/20 pt educated on HEP and given handout    Time 2    Period Weeks    Status New    Target Date 03/03/20             PT Long Term Goals - 02/18/20 1304      PT LONG TERM GOAL #1   Title Pt will increase FOTO score to 59 to indicate increased functional mobility for ADLs.    Baseline 02/18/20 FOTO 41    Time 8    Period Weeks    Status New    Target Date 04/14/20      PT LONG TERM GOAL #2   Title Pt will decrease worst hip pain as reported on NPRS by at least 2 points in order to demonstrate clinically significant reduction in hip pain.    Baseline 02/18/20 Worst 8/10    Time 8    Period Weeks     Status New    Target Date 04/14/20      PT LONG TERM GOAL #3   Title Pt will decrease TUG to below 14 seconds in order to demonstrate decreased fall risk.  Baseline 02/18/20 TUG 24.7 sec    Time 8    Period Weeks    Status New    Target Date 04/14/20      PT LONG TERM GOAL #4   Title Pt will decrease 5xSTS to 15 seconds or less to match pt population age norms indicating increased LE strength and decreased fall risk.    Baseline 02/18/20 5xSTS 45 sec    Time 8    Period Weeks    Status New    Target Date 04/14/20      PT LONG TERM GOAL #5   Title Pt will ambulate with gait speed of 1.22m/s to demonstrate safe community ambulation.    Baseline 02/18/20 >1.15m/s not formally assessed, to be assessed next session    Time 8    Period Weeks    Status New    Target Date 04/14/20                 Plan - 02/27/20 1016    Clinical Impression Statement PT continued hip strengthening and gait training with good success.  Pt with reduced tension in glute max/med with reduced tenderness to palpation and STM.  Pt requires cueing for all hip stengthening therex with decent carry over to reduce compensation d/t reduced core and quad strength.  Pt with improved gait mechanics and posture during amb with SPC in LUE; needs further instruction to improve comfort with SPC use in LUE.  PT will continue progression as able to improve global LE weakness, pain, and gait mechanics.    Personal Factors and Comorbidities Age;Comorbidity 1    Comorbidities HTN    Examination-Activity Limitations Bathing;Bed Mobility;Bend;Caring for Others;Carry;Dressing;Lift;Locomotion Level;Sit;Squat;Stairs;Stand;Toileting;Transfers    Examination-Participation Restrictions Cleaning;Laundry;Community Activity    Stability/Clinical Decision Making Evolving/Moderate complexity    Clinical Decision Making Moderate    Rehab Potential Good    PT Frequency 2x / week    PT Duration 8 weeks    PT Treatment/Interventions  ADLs/Self Care Home Management;Electrical Stimulation;Therapeutic activities;Patient/family education;Energy conservation;Therapeutic exercise;Spinal Manipulations;Joint Manipulations;Biofeedback;Cryotherapy;Moist Heat;Traction;Gait training;Stair training;Functional mobility training;Neuromuscular re-education;Balance training;Manual techniques;Passive range of motion;Taping;Dry needling;DME Instruction    PT Next Visit Plan Gait training with SPC, core/glute strengthening    PT Home Exercise Plan Posterior pevlic tilts, seated walk out stretch on table, seated hip abd/ knee ext, sit to stands    Consulted and Agree with Plan of Care Patient           Patient will benefit from skilled therapeutic intervention in order to improve the following deficits and impairments:  Abnormal gait, Improper body mechanics, Pain, Decreased coordination, Decreased mobility, Decreased activity tolerance, Decreased endurance, Decreased range of motion, Decreased strength, Hypomobility, Decreased balance, Difficulty walking, Impaired flexibility  Visit Diagnosis: Pain in right hip  Chronic right-sided low back pain, unspecified whether sciatica present  Sacroiliac dysfunction     Problem List Patient Active Problem List   Diagnosis Date Noted  . Essential hypertension, benign 08/24/2016  . Splenic artery aneurysm ([place]) 08/24/2016   Durwin Reges DPT Chinita Greenland, SPT Durwin Reges 02/27/2020, 1:52 PM  [facility] [address], [place], [address] Phone: [phone]   Fax:  [phone]  Name: Traci Quinn MRN: [id number] Date of Birth: 10-15-1933

## 2020-03-04 ENCOUNTER — Encounter: Payer: Self-pay | Admitting: Physical Therapy

## 2020-03-04 ENCOUNTER — Other Ambulatory Visit: Payer: Self-pay

## 2020-03-04 ENCOUNTER — Ambulatory Visit: Payer: Medicare HMO | Admitting: Physical Therapy

## 2020-03-04 DIAGNOSIS — M25551 Pain in right hip: Secondary | ICD-10-CM | POA: Diagnosis not present

## 2020-03-04 DIAGNOSIS — M533 Sacrococcygeal disorders, not elsewhere classified: Secondary | ICD-10-CM

## 2020-03-04 DIAGNOSIS — G8929 Other chronic pain: Secondary | ICD-10-CM

## 2020-03-04 NOTE — Therapy (Signed)
[facility] [address], [place], [address] Phone: [phone]   Fax:  [phone]  Physical Therapy Treatment  Patient Details  Name: Traci Quinn MRN: [id number] Date of Birth: 01-03-1934 No data recorded  Encounter Date: 03/04/2020   PT End of Session - 03/04/20 1121    Visit Number 4    Number of Visits 16    Date for PT Re-Evaluation 04/14/20    PT Start Time 1115    PT Stop Time 1200    PT Time Calculation (min) 45 min    Activity Tolerance Patient tolerated treatment well;No increased pain    Behavior During Therapy WFL for tasks assessed/performed           Past Medical History:  Diagnosis Date  . Arthritis   . Asthma   . Cancer [facility])    left sided breast cancer  . GERD (gastroesophageal reflux disease)   . Hypertension     Past Surgical History:  Procedure Laterality Date  . ABDOMINAL HYSTERECTOMY    . APPENDECTOMY    . BREAST EXCISIONAL BIOPSY Right 1970   neg surgical exc  . BREAST LUMPECTOMY Left 2000   rad and no chemo  . BREAST SURGERY     lumpectomy  . CHOLECYSTECTOMY    . ESOPHAGOGASTRODUODENOSCOPY (EGD) WITH PROPOFOL N/A 08/06/2016   Procedure: ESOPHAGOGASTRODUODENOSCOPY (EGD) WITH PROPOFOL;  Surgeon: Lollie Sails, MD;  Location: [facility] ENDOSCOPY;  Service: Endoscopy;  Laterality: N/A;    There were no vitals filed for this visit.   Subjective Assessment - 03/04/20 1119    Subjective Pt reports a busy weekend and is tired today.  Pt reports her pain is getting better, 5/10 in the R hip.  Compliance with HEP.    Pertinent History Pt is an 86 y.o female presenting to PT with c/o of R hip and low back pain 4 weeks s/p fall onto R posterolateral hip.  Pt reports she lost her balance while picking up something off her couch and rotating to place it on a table.  Pt reports this is the only fall she has had in the past 6 mo.  Pt reports aching pain with occasional sharp  pain in R hip and R low back (Worst: 8/10, Best: 0/10, Current 4/10) with pain radiating into anterior thigh to anterolateral knee.  Pain is worse in the mornings, gets better as the day goes on, and is aggravated by lifting the R leg, standing from a seated position, stairs, and walking.  Pt reports no previous hx of falls, trauma, or hx of back/hip injury or pain.  Pt reports she had sx of sciatica isolated to the R low back prior to her fall with no prior tx.  Prior to injury, pt amb using a SPC on outdoor, uneven surfaces only; now is using the [facility] any time she leaves her home.  Pt lives in a single story home with her 70 y.o. great grandson of whom she is the primary caregiver; pt reports her daughter also lives with her but she works and is only home in the late evening.  Pt has a ramp entry that she uses, and avoids using other entrances in her home with stairs d/t difficulty with ascending/descending stairs.  Pt also reports difficulty with completing household chores d/t hip pain.  Pt goals with therapy are to be able to lift RLE and reduce pain.  Pt reported PMH of breast cancer that  is in remission, HTN, and asthma.  Pt denies N/V, B&B changes, unexplained weight fluctuation, saddle paresthesia, fever, night sweats, or unrelenting night pain at this time.    Limitations Sitting;Lifting;Standing;Walking;House hold activities    How long can you sit comfortably? 15 min    How long can you stand comfortably? 10 min    How long can you walk comfortably? 1-2 min    Patient Stated Goals To be able to lift RLE and reduce hip/back pain    Currently in Pain? Yes    Pain Score 5     Pain Location Hip    Pain Onset 1 to 4 weeks ago           Manual Therapy -STM to superior medial glute max just lateral to sacrum, and glute max/med along ilium for TrP release; pt TTP at medial glute just lateral to superior sacral attachment and along IT band to distal attachment (increased pain distally) -Sidelying  passive hip extension for hip flexor stretch 3x30 sec with minimal range d/t anterolateral LE pain that subsides when out of position; reduced range and increased pain noted with increased knee flexion  TherEx -Glute bridge x8 with cueing for glute activation -Glute bridge with YTB 2x8 with cueing for neutral knee alignment against band resistance -Supine clam shell with YTB 3x8 with cueing for eccentric control -Supine marching x10 with increase in LE that persists 2-3 minutes post cessation of activity  Gait Training -Amb 100 ft in clinic with SPC on L -Amb with cone weaving 4x45ft, SPC on L with cueing for placement of SPC to navigate cones safely     PT Education - 03/04/20 1120    Education Details Therex form/ mechanics, gait mechanics    Person(s) Educated Patient    Methods Explanation;Demonstration;Verbal cues    Comprehension Verbalized understanding;Returned demonstration            PT Short Term Goals - 02/18/20 1254      PT SHORT TERM GOAL #1   Title Pt will be independent with HEP in order to improve strength and balance in order to decrease fall risk and improve function at home and work.    Baseline 02/18/20 pt educated on HEP and given handout    Time 2    Period Weeks    Status New    Target Date 03/03/20             PT Long Term Goals - 02/18/20 1304      PT LONG TERM GOAL #1   Title Pt will increase FOTO score to 59 to indicate increased functional mobility for ADLs.    Baseline 02/18/20 FOTO 41    Time 8    Period Weeks    Status New    Target Date 04/14/20      PT LONG TERM GOAL #2   Title Pt will decrease worst hip pain as reported on NPRS by at least 2 points in order to demonstrate clinically significant reduction in hip pain.    Baseline 02/18/20 Worst 8/10    Time 8    Period Weeks    Status New    Target Date 04/14/20      PT LONG TERM GOAL #3   Title Pt will decrease TUG to below 14 seconds in order to demonstrate decreased fall  risk.    Baseline 02/18/20 TUG 24.7 sec    Time 8    Period Weeks    Status New  Target Date 04/14/20      PT LONG TERM GOAL #4   Title Pt will decrease 5xSTS to 15 seconds or less to match pt population age norms indicating increased LE strength and decreased fall risk.    Baseline 02/18/20 5xSTS 45 sec    Time 8    Period Weeks    Status New    Target Date 04/14/20      PT LONG TERM GOAL #5   Title Pt will ambulate with gait speed of 1.36m/s to demonstrate safe community ambulation.    Baseline 02/18/20 >1.75m/s not formally assessed, to be assessed next session    Time 8    Period Weeks    Status New    Target Date 04/14/20                 Plan - 03/04/20 1149    Clinical Impression Statement PT continued hip strengthening and gait training with good success.  Pt TTP along R medial glute max at proximal attachement lateral to sacrum, and along R IT band to distal attachment; concordant pain in RLE at hip and proximal to knee.  Pt with no increase in pain with hip strengthening exercises.  Pt with increased pain with marching activities that subsides after 2-3 minutes post cessation of activity.  Pt with good carry over of cueing for gait mechanics with SPC and requires supervision for gait activities.  PT will continue progression as able.    Personal Factors and Comorbidities Age;Comorbidity 1    Comorbidities HTN    Examination-Activity Limitations Bathing;Bed Mobility;Bend;Caring for Others;Carry;Dressing;Lift;Locomotion Level;Sit;Squat;Stairs;Stand;Toileting;Transfers    Examination-Participation Restrictions Cleaning;Laundry;Community Activity    Stability/Clinical Decision Making Evolving/Moderate complexity    Clinical Decision Making Moderate    Rehab Potential Good    PT Frequency 2x / week    PT Duration 8 weeks    PT Treatment/Interventions ADLs/Self Care Home Management;Electrical Stimulation;Therapeutic activities;Patient/family education;Energy  conservation;Therapeutic exercise;Spinal Manipulations;Joint Manipulations;Biofeedback;Cryotherapy;Moist Heat;Traction;Gait training;Stair training;Functional mobility training;Neuromuscular re-education;Balance training;Manual techniques;Passive range of motion;Taping;Dry needling;DME Instruction    PT Next Visit Plan Gait training with SPC (curbs, obstacle weaving), core/glute strengthening    PT Home Exercise Plan Posterior pevlic tilts, seated walk out stretch on table, seated hip abd/ knee ext, sit to stands    Consulted and Agree with Plan of Care Patient           Patient will benefit from skilled therapeutic intervention in order to improve the following deficits and impairments:  Abnormal gait, Improper body mechanics, Pain, Decreased coordination, Decreased mobility, Decreased activity tolerance, Decreased endurance, Decreased range of motion, Decreased strength, Hypomobility, Decreased balance, Difficulty walking, Impaired flexibility  Visit Diagnosis: Pain in right hip  Chronic right-sided low back pain, unspecified whether sciatica present  Sacroiliac dysfunction     Problem List Patient Active Problem List   Diagnosis Date Noted  . Essential hypertension, benign 08/24/2016  . Splenic artery aneurysm ([place]) 08/24/2016    Durwin Reges DPT Chinita Greenland, SPT Durwin Reges 03/04/2020, 2:30 PM  [facility] [address] [place], [address] Phone: [phone]   Fax:  [phone]  Name: ZAKIYA SPORRER MRN: [id number] Date of Birth: 19-May-1934

## 2020-03-06 ENCOUNTER — Encounter: Payer: Self-pay | Admitting: Physical Therapy

## 2020-03-06 ENCOUNTER — Other Ambulatory Visit: Payer: Self-pay

## 2020-03-06 ENCOUNTER — Ambulatory Visit: Payer: Medicare HMO | Admitting: Physical Therapy

## 2020-03-06 DIAGNOSIS — M25551 Pain in right hip: Secondary | ICD-10-CM | POA: Diagnosis not present

## 2020-03-06 DIAGNOSIS — M545 Low back pain, unspecified: Secondary | ICD-10-CM

## 2020-03-06 DIAGNOSIS — M533 Sacrococcygeal disorders, not elsewhere classified: Secondary | ICD-10-CM

## 2020-03-06 NOTE — Therapy (Signed)
[facility] [address], [place], [address] Phone: [phone]   Fax:  [phone]  Physical Therapy Treatment  Patient Details  Name: Traci Quinn MRN: [id number] Date of Birth: 1934-07-08 No data recorded  Encounter Date: 03/06/2020   PT End of Session - 03/06/20 1026    Visit Number 5    Number of Visits 16    Date for PT Re-Evaluation 04/14/20    PT Start Time 0945    PT Stop Time 1038    PT Time Calculation (min) 53 min    Equipment Utilized During Treatment Gait belt    Activity Tolerance Patient tolerated treatment well;No increased pain    Behavior During Therapy WFL for tasks assessed/performed           Past Medical History:  Diagnosis Date  . Arthritis   . Asthma   . Cancer [facility])    left sided breast cancer  . GERD (gastroesophageal reflux disease)   . Hypertension     Past Surgical History:  Procedure Laterality Date  . ABDOMINAL HYSTERECTOMY    . APPENDECTOMY    . BREAST EXCISIONAL BIOPSY Right 1970   neg surgical exc  . BREAST LUMPECTOMY Left 2000   rad and no chemo  . BREAST SURGERY     lumpectomy  . CHOLECYSTECTOMY    . ESOPHAGOGASTRODUODENOSCOPY (EGD) WITH PROPOFOL N/A 08/06/2016   Procedure: ESOPHAGOGASTRODUODENOSCOPY (EGD) WITH PROPOFOL;  Surgeon: Lollie Sails, MD;  Location: [facility] ENDOSCOPY;  Service: Endoscopy;  Laterality: N/A;    There were no vitals filed for this visit.   Subjective Assessment - 03/06/20 0949    Subjective PT reports 5/10 pain in the R hip and lateral knee along IT band.  Pt reports compliance with HEP and some pain in the right knee with resisted hip abduction.    Pertinent History Pt is an 86 y.o female presenting to PT with c/o of R hip and low back pain 4 weeks s/p fall onto R posterolateral hip.  Pt reports she lost her balance while picking up something off her couch and rotating to place it on a table.  Pt reports this is the only  fall she has had in the past 6 mo.  Pt reports aching pain with occasional sharp pain in R hip and R low back (Worst: 8/10, Best: 0/10, Current 4/10) with pain radiating into anterior thigh to anterolateral knee.  Pain is worse in the mornings, gets better as the day goes on, and is aggravated by lifting the R leg, standing from a seated position, stairs, and walking.  Pt reports no previous hx of falls, trauma, or hx of back/hip injury or pain.  Pt reports she had sx of sciatica isolated to the R low back prior to her fall with no prior tx.  Prior to injury, pt amb using a SPC on outdoor, uneven surfaces only; now is using the [facility] any time she leaves her home.  Pt lives in a single story home with her 80 y.o. great grandson of whom she is the primary caregiver; pt reports her daughter also lives with her but she works and is only home in the late evening.  Pt has a ramp entry that she uses, and avoids using other entrances in her home with stairs d/t difficulty with ascending/descending stairs.  Pt also reports difficulty with completing household chores d/t hip pain.  Pt goals with therapy are to be able  to lift RLE and reduce pain.  Pt reported PMH of breast cancer that is in remission, HTN, and asthma.  Pt denies N/V, B&B changes, unexplained weight fluctuation, saddle paresthesia, fever, night sweats, or unrelenting night pain at this time.    Limitations Sitting;Lifting;Standing;Walking;House hold activities    How long can you sit comfortably? 15 min    How long can you stand comfortably? 10 min    How long can you walk comfortably? 1-2 min    Patient Stated Goals To be able to lift RLE and reduce hip/back pain    Currently in Pain? Yes    Pain Score 5     Pain Location Hip    Pain Orientation Right    Pain Onset 1 to 4 weeks ago           Manual Therapy -STM to superior medial glute max just lateral to sacrum, and glute max/med along ilium for TrP release with improvements in myofascial  tightness noted -Prone sacral nutation mobilization grade 1-2 3x30 sec with no increase in pain -Supine hip IR/ER PROM x4 each with no increase in pain and minimal ROM restrictions -Supine hip flexion PROM with 3x30sec holds; hip flexion more painful and guarded, pt able to relax with cueing with some reduction in pain -Hip inferior glide mobilization low grade 1-2, unable to increase grade d/t pain with pressure on proximal anterior thigh; reduced pain with hip flexion PROM post with reduced guarding -Sidelying PT assisted roll out of R IT band with low grade pressure for pain tolerance   TherEx -Glute bridge with YTB 3x10 with cueing for neutral knee alignment against band resistance -Supine clam shell with YTB x10, RTB 2x10 with cueing for eccentric control -Alternating stair taps on 3" step 3x10 with intermittent UE support for balance; no increase in R hip/LE pain   Gait Training -Amb 100 ft in clinic and on outdoor incline to car with SPC on L; good coordination of SPC placement, independent and safe -Step up/down on 6" curb from sidewalk to parking lot with supervision x4 with cueing for coordination of SPC and RLE for step down and step up with LLE with good carry over; pt with some difficulty with LLE step up d/t weakness, able to complete safely with supervision    PT Education - 03/06/20 0951    Education Details Therex form/mechanics, gait mechanics    Person(s) Educated Patient    Methods Explanation;Demonstration;Verbal cues    Comprehension Verbalized understanding;Returned demonstration            PT Short Term Goals - 02/18/20 1254      PT SHORT TERM GOAL #1   Title Pt will be independent with HEP in order to improve strength and balance in order to decrease fall risk and improve function at home and work.    Baseline 02/18/20 pt educated on HEP and given handout    Time 2    Period Weeks    Status New    Target Date 03/03/20             PT Long Term Goals -  02/18/20 1304      PT LONG TERM GOAL #1   Title Pt will increase FOTO score to 59 to indicate increased functional mobility for ADLs.    Baseline 02/18/20 FOTO 41    Time 8    Period Weeks    Status New    Target Date 04/14/20      PT LONG  TERM GOAL #2   Title Pt will decrease worst hip pain as reported on NPRS by at least 2 points in order to demonstrate clinically significant reduction in hip pain.    Baseline 02/18/20 Worst 8/10    Time 8    Period Weeks    Status New    Target Date 04/14/20      PT LONG TERM GOAL #3   Title Pt will decrease TUG to below 14 seconds in order to demonstrate decreased fall risk.    Baseline 02/18/20 TUG 24.7 sec    Time 8    Period Weeks    Status New    Target Date 04/14/20      PT LONG TERM GOAL #4   Title Pt will decrease 5xSTS to 15 seconds or less to match pt population age norms indicating increased LE strength and decreased fall risk.    Baseline 02/18/20 5xSTS 45 sec    Time 8    Period Weeks    Status New    Target Date 04/14/20      PT LONG TERM GOAL #5   Title Pt will ambulate with gait speed of 1.15m/s to demonstrate safe community ambulation.    Baseline 02/18/20 >1.60m/s not formally assessed, to be assessed next session    Time 8    Period Weeks    Status New    Target Date 04/14/20                 Plan - 03/06/20 1026    Clinical Impression Statement PT continued hip strengthening and gait training, pt somewhat limited by R hip/LE pain today.  Pt with difficulty with hip flexion and abduction activities d/t pain in R hip and along IT band to distal insertion.  Pt tolerated low grade sacral and hip mobilizations and STM to glute/lateral LE; minimal increase in pain with increased hip flexion that subsides to baseline pain out of position.  No increase in pain with hip strengthening therex.  Pt with improved gait mechanics with SPC in LUE; able to navigate curbs safely.  Pt educated on the use of ice and heat at home.  PT  will continue progression as able.    Personal Factors and Comorbidities Age;Comorbidity 1    Comorbidities HTN    Examination-Activity Limitations Bathing;Bed Mobility;Bend;Caring for Others;Carry;Dressing;Lift;Locomotion Level;Sit;Squat;Stairs;Stand;Toileting;Transfers    Examination-Participation Restrictions Cleaning;Laundry;Community Activity    Stability/Clinical Decision Making Evolving/Moderate complexity    Clinical Decision Making Moderate    Rehab Potential Good    PT Frequency 2x / week    PT Duration 8 weeks    PT Treatment/Interventions ADLs/Self Care Home Management;Electrical Stimulation;Therapeutic activities;Patient/family education;Energy conservation;Therapeutic exercise;Spinal Manipulations;Joint Manipulations;Biofeedback;Cryotherapy;Moist Heat;Traction;Gait training;Stair training;Functional mobility training;Neuromuscular re-education;Balance training;Manual techniques;Passive range of motion;Taping;Dry needling;DME Instruction    PT Next Visit Plan Low level hip flexion activities (stair tap, marching), core/glute strengthening, butterfly stretch    PT Home Exercise Plan Posterior pevlic tilts, seated walk out stretch on table, seated hip abd/ knee ext, sit to stands    Consulted and Agree with Plan of Care Patient           Patient will benefit from skilled therapeutic intervention in order to improve the following deficits and impairments:  Abnormal gait, Improper body mechanics, Pain, Decreased coordination, Decreased mobility, Decreased activity tolerance, Decreased endurance, Decreased range of motion, Decreased strength, Hypomobility, Decreased balance, Difficulty walking, Impaired flexibility  Visit Diagnosis: Pain in right hip  Chronic right-sided low back pain, unspecified whether sciatica  present  Sacroiliac dysfunction     Problem List Patient Active Problem List   Diagnosis Date Noted  . Essential hypertension, benign 08/24/2016  . Splenic artery  aneurysm ([place]) 08/24/2016    Durwin Reges DPT Chinita Greenland, SPT Durwin Reges 03/06/2020, 11:32 AM  [facility] [address], [place], [address] Phone: [phone]   Fax:  [phone]  Name: CHARLES NIESE MRN: [id number] Date of Birth: March 30, 1934

## 2020-03-12 ENCOUNTER — Other Ambulatory Visit: Payer: Self-pay

## 2020-03-12 ENCOUNTER — Ambulatory Visit: Payer: Medicare HMO | Admitting: Physical Therapy

## 2020-03-12 ENCOUNTER — Encounter: Payer: Self-pay | Admitting: Physical Therapy

## 2020-03-12 DIAGNOSIS — M25551 Pain in right hip: Secondary | ICD-10-CM

## 2020-03-12 DIAGNOSIS — M533 Sacrococcygeal disorders, not elsewhere classified: Secondary | ICD-10-CM

## 2020-03-12 DIAGNOSIS — G8929 Other chronic pain: Secondary | ICD-10-CM

## 2020-03-12 NOTE — Therapy (Signed)
[facility] [address] [place], [address] Phone: [phone]   Fax:  [phone]  Physical Therapy Treatment  Patient Details  Name: Traci Quinn MRN: [id number] Date of Birth: October 14, 1933 No data recorded  Encounter Date: 03/12/2020   PT End of Session - 03/12/20 0952    Visit Number 6    Number of Visits 16    Date for PT Re-Evaluation 04/14/20    PT Start Time 0945    PT Stop Time 1030    PT Time Calculation (min) 45 min    Equipment Utilized During Treatment Gait belt    Activity Tolerance Patient tolerated treatment well;No increased pain    Behavior During Therapy WFL for tasks assessed/performed           Past Medical History:  Diagnosis Date  . Arthritis   . Asthma   . Cancer [facility])    left sided breast cancer  . GERD (gastroesophageal reflux disease)   . Hypertension     Past Surgical History:  Procedure Laterality Date  . ABDOMINAL HYSTERECTOMY    . APPENDECTOMY    . BREAST EXCISIONAL BIOPSY Right 1970   neg surgical exc  . BREAST LUMPECTOMY Left 2000   rad and no chemo  . BREAST SURGERY     lumpectomy  . CHOLECYSTECTOMY    . ESOPHAGOGASTRODUODENOSCOPY (EGD) WITH PROPOFOL N/A 08/06/2016   Procedure: ESOPHAGOGASTRODUODENOSCOPY (EGD) WITH PROPOFOL;  Surgeon: Lollie Sails, MD;  Location: [facility] ENDOSCOPY;  Service: Endoscopy;  Laterality: N/A;    There were no vitals filed for this visit.   Subjective Assessment - 03/12/20 0951    Subjective Pt reports 4/10 pain in the R hip.  Pt reports compliance with HEP and some difficulty tb exercises.    Pertinent History Pt is an 86 y.o female presenting to PT with c/o of R hip and low back pain 4 weeks s/p fall onto R posterolateral hip.  Pt reports she lost her balance while picking up something off her couch and rotating to place it on a table.  Pt reports this is the only fall she has had in the past 6 mo.  Pt reports aching  pain with occasional sharp pain in R hip and R low back (Worst: 8/10, Best: 0/10, Current 4/10) with pain radiating into anterior thigh to anterolateral knee.  Pain is worse in the mornings, gets better as the day goes on, and is aggravated by lifting the R leg, standing from a seated position, stairs, and walking.  Pt reports no previous hx of falls, trauma, or hx of back/hip injury or pain.  Pt reports she had sx of sciatica isolated to the R low back prior to her fall with no prior tx.  Prior to injury, pt amb using a SPC on outdoor, uneven surfaces only; now is using the [facility] any time she leaves her home.  Pt lives in a single story home with her 78 y.o. great grandson of whom she is the primary caregiver; pt reports her daughter also lives with her but she works and is only home in the late evening.  Pt has a ramp entry that she uses, and avoids using other entrances in her home with stairs d/t difficulty with ascending/descending stairs.  Pt also reports difficulty with completing household chores d/t hip pain.  Pt goals with therapy are to be able to lift RLE and reduce pain.  Pt reported PMH of breast  cancer that is in remission, HTN, and asthma.  Pt denies N/V, B&B changes, unexplained weight fluctuation, saddle paresthesia, fever, night sweats, or unrelenting night pain at this time.    Limitations Sitting;Lifting;Standing;Walking;House hold activities    How long can you sit comfortably? 15 min    How long can you stand comfortably? 10 min    How long can you walk comfortably? 1-2 min    Patient Stated Goals To be able to lift RLE and reduce hip/back pain    Currently in Pain? Yes    Pain Score 4     Pain Location Hip    Pain Onset 1 to 4 weeks ago            Manual Therapy -STM to R vastus lateralis, rectus femoris, and glute max along ilium and lateral to superior sacral attachment -Roll out of R lateral leg along IT band   TherEx -Nustep L2 x74min with seat and UE setting 6 -Glute  bridge with YTB 3x10 with cueing for neutral knee alignment against band resistance and glute activation -Supine clam shell with YTB 3x10 with cueing for eccentric control -Supine butterfly stretch 2x30 sec  -Hip hike with flexion onto 3" step x8 with cueing to hike hip prior to knee flexion for improved activation of glute med with difficulty with motor control -Hip hike x8 with cueing to keep knee extended while lifting heel off ground for reduced hip flexion compensation and isolation of glute med with difficulty with motor control -Alternating stair tap onto 3" step 2x10 with cueing for upright posture; minimal increase in pain noted in anterolateral RLE proximal to the knee, pain resolves to baseline with cessation of activity -Standing hip abduction 2x8 with cueing for upright posture and reducing hip ER to limit hip flexion compensation and isolate glute min activation with decent carry over   Gait Training -Step up onto and down from 3" step x5 with [facility] for coordination of ascend/descending curbs -Amb 137ft in clinic and to car in parking lot with SPC on L and pocketbook on R shoulder for practice with carrying objects in RUE with amb; with good carry over of descending curb mechanics in parking lot    PT Education - 03/12/20 0952    Education Details Therex form/mechanics, gait mechanics    Person(s) Educated Patient    Methods Explanation;Demonstration;Verbal cues    Comprehension Verbalized understanding;Returned demonstration            PT Short Term Goals - 02/18/20 1254      PT SHORT TERM GOAL #1   Title Pt will be independent with HEP in order to improve strength and balance in order to decrease fall risk and improve function at home and work.    Baseline 02/18/20 pt educated on HEP and given handout    Time 2    Period Weeks    Status New    Target Date 03/03/20             PT Long Term Goals - 02/18/20 1304      PT LONG TERM GOAL #1   Title Pt will increase  FOTO score to 59 to indicate increased functional mobility for ADLs.    Baseline 02/18/20 FOTO 41    Time 8    Period Weeks    Status New    Target Date 04/14/20      PT LONG TERM GOAL #2   Title Pt will decrease worst hip pain as reported on  NPRS by at least 2 points in order to demonstrate clinically significant reduction in hip pain.    Baseline 02/18/20 Worst 8/10    Time 8    Period Weeks    Status New    Target Date 04/14/20      PT LONG TERM GOAL #3   Title Pt will decrease TUG to below 14 seconds in order to demonstrate decreased fall risk.    Baseline 02/18/20 TUG 24.7 sec    Time 8    Period Weeks    Status New    Target Date 04/14/20      PT LONG TERM GOAL #4   Title Pt will decrease 5xSTS to 15 seconds or less to match pt population age norms indicating increased LE strength and decreased fall risk.    Baseline 02/18/20 5xSTS 45 sec    Time 8    Period Weeks    Status New    Target Date 04/14/20      PT LONG TERM GOAL #5   Title Pt will ambulate with gait speed of 1.78m/s to demonstrate safe community ambulation.    Baseline 02/18/20 >1.45m/s not formally assessed, to be assessed next session    Time 8    Period Weeks    Status New    Target Date 04/14/20                 Plan - 03/12/20 1006    Clinical Impression Statement PT continued hip strengthening and gait training with good success.  Pt with minor increase in RLE pain in anterolateral thigh with step up, hip/knee flexion activities; subsides with cessation of activity.  Pt with difficulty firing L glute med for standing therex, no increase in pain.  Pt requires supervision for gait and standing therex for safety.  Pt with improved coordination with SPC on L with navigation of curbs, no LOB.  Pt reports increased comfort with SPC in LUE, but that she is still "getting used to it."  PT will continue progression as able.    Personal Factors and Comorbidities Age;Comorbidity 1    Comorbidities HTN     Examination-Activity Limitations Bathing;Bed Mobility;Bend;Caring for Others;Carry;Dressing;Lift;Locomotion Level;Sit;Squat;Stairs;Stand;Toileting;Transfers    Examination-Participation Restrictions Cleaning;Laundry;Community Activity    Stability/Clinical Decision Making Evolving/Moderate complexity    Clinical Decision Making Moderate    Rehab Potential Good    PT Frequency 2x / week    PT Duration 8 weeks    PT Treatment/Interventions ADLs/Self Care Home Management;Electrical Stimulation;Therapeutic activities;Patient/family education;Energy conservation;Therapeutic exercise;Spinal Manipulations;Joint Manipulations;Biofeedback;Cryotherapy;Moist Heat;Traction;Gait training;Stair training;Functional mobility training;Neuromuscular re-education;Balance training;Manual techniques;Passive range of motion;Taping;Dry needling;DME Instruction    PT Next Visit Plan Low level hip flexion activities (stair tap, marching), core/glute strengthening    PT Home Exercise Plan Posterior pevlic tilts, seated walk out stretch on table, seated hip abd/ knee ext, sit to stands    Consulted and Agree with Plan of Care Patient           Patient will benefit from skilled therapeutic intervention in order to improve the following deficits and impairments:  Abnormal gait, Improper body mechanics, Pain, Decreased coordination, Decreased mobility, Decreased activity tolerance, Decreased endurance, Decreased range of motion, Decreased strength, Hypomobility, Decreased balance, Difficulty walking, Impaired flexibility  Visit Diagnosis: Pain in right hip  Chronic right-sided low back pain, unspecified whether sciatica present  Sacroiliac dysfunction     Problem List Patient Active Problem List   Diagnosis Date Noted  . Essential hypertension, benign 08/24/2016  . Splenic artery  aneurysm [facility]) 08/24/2016    Durwin Reges DPT Chinita Greenland, SPT Durwin Reges 03/12/2020, 1:28 PM  [place] [address] [place], [address] Phone: [phone]   Fax:  [phone]  Name: Traci Quinn MRN: [id number] Date of Birth: 1934/08/15

## 2020-03-17 ENCOUNTER — Encounter: Payer: Self-pay | Admitting: Physical Therapy

## 2020-03-17 ENCOUNTER — Other Ambulatory Visit: Payer: Self-pay

## 2020-03-17 ENCOUNTER — Ambulatory Visit: Payer: Medicare HMO | Admitting: Physical Therapy

## 2020-03-17 DIAGNOSIS — M533 Sacrococcygeal disorders, not elsewhere classified: Secondary | ICD-10-CM

## 2020-03-17 DIAGNOSIS — M25551 Pain in right hip: Secondary | ICD-10-CM | POA: Diagnosis not present

## 2020-03-17 DIAGNOSIS — M545 Low back pain, unspecified: Secondary | ICD-10-CM

## 2020-03-17 NOTE — Therapy (Signed)
[facility] [address] [place], [address] Phone: [phone]   Fax:  [phone]  Physical Therapy Treatment  Patient Details  Name: Traci Quinn MRN: [id number] Date of Birth: Jul 08, 1934 No data recorded  Encounter Date: 03/17/2020   PT End of Session - 03/17/20 1604    Visit Number 7    Number of Visits 16    Date for PT Re-Evaluation 04/14/20    PT Start Time 0400    PT Stop Time 0445    PT Time Calculation (min) 45 min    Equipment Utilized During Treatment Gait belt    Activity Tolerance Patient tolerated treatment well;No increased pain    Behavior During Therapy WFL for tasks assessed/performed           Past Medical History:  Diagnosis Date  . Arthritis   . Asthma   . Cancer [facility])    left sided breast cancer  . GERD (gastroesophageal reflux disease)   . Hypertension     Past Surgical History:  Procedure Laterality Date  . ABDOMINAL HYSTERECTOMY    . APPENDECTOMY    . BREAST EXCISIONAL BIOPSY Right 1970   neg surgical exc  . BREAST LUMPECTOMY Left 2000   rad and no chemo  . BREAST SURGERY     lumpectomy  . CHOLECYSTECTOMY    . ESOPHAGOGASTRODUODENOSCOPY (EGD) WITH PROPOFOL N/A 08/06/2016   Procedure: ESOPHAGOGASTRODUODENOSCOPY (EGD) WITH PROPOFOL;  Surgeon: Lollie Sails, MD;  Location: [facility] ENDOSCOPY;  Service: Endoscopy;  Laterality: N/A;    There were no vitals filed for this visit.   Subjective Assessment - 03/17/20 1603    Subjective Pt reports 3/10 pain in the R hip and LE proximal to the knee. Pt reports compliance with HEP.    Pertinent History Pt is an 86 y.o female presenting to PT with c/o of R hip and low back pain 4 weeks s/p fall onto R posterolateral hip.  Pt reports she lost her balance while picking up something off her couch and rotating to place it on a table.  Pt reports this is the only fall she has had in the past 6 mo.  Pt reports aching pain with  occasional sharp pain in R hip and R low back (Worst: 8/10, Best: 0/10, Current 4/10) with pain radiating into anterior thigh to anterolateral knee.  Pain is worse in the mornings, gets better as the day goes on, and is aggravated by lifting the R leg, standing from a seated position, stairs, and walking.  Pt reports no previous hx of falls, trauma, or hx of back/hip injury or pain.  Pt reports she had sx of sciatica isolated to the R low back prior to her fall with no prior tx.  Prior to injury, pt amb using a SPC on outdoor, uneven surfaces only; now is using the [facility] any time she leaves her home.  Pt lives in a single story home with her 18 y.o. great grandson of whom she is the primary caregiver; pt reports her daughter also lives with her but she works and is only home in the late evening.  Pt has a ramp entry that she uses, and avoids using other entrances in her home with stairs d/t difficulty with ascending/descending stairs.  Pt also reports difficulty with completing household chores d/t hip pain.  Pt goals with therapy are to be able to lift RLE and reduce pain.  Pt reported PMH of breast  cancer that is in remission, HTN, and asthma.  Pt denies N/V, B&B changes, unexplained weight fluctuation, saddle paresthesia, fever, night sweats, or unrelenting night pain at this time.    Limitations Sitting;Lifting;Standing;Walking;House hold activities    How long can you sit comfortably? 15 min    How long can you stand comfortably? 10 min    How long can you walk comfortably? 1-2 min    Patient Stated Goals To be able to lift RLE and reduce hip/back pain    Currently in Pain? Yes    Pain Score 3     Pain Onset 1 to 4 weeks ago           Manual Therapy -STM to R glute max along ilium and lateral to superior sacral attachment for TrP release, reported concordant hip pain -Roll out of R lateral leg along IT band   TherEx -Nustep L2 x41min with seat and UE setting 6 -Sidelying R clamshell with YTB  2x8 -Glute bridge with RTB 2x10 with cueing for neutral knee alignment against band resistance and glute activation -Supine hip flexor stretch x1 min -Alternating stair taps onto 3" step 2x10 -Step up onto 3" step 2x5 B with cueing for hip hinge and upright posture   Gait Training -Amb 34ft with step over 3x3" hurdles x3, with cueing for [facility] coordination for balance with stepping over objects, one instance of posterior stepping strategy d/t misplacement of SPC during step over with RLE (able to independently correct balance with stepping strategies), corrects SPC coordination with cueing and no balance errors post; pt reports pain in RLE when attempting to clear hurdle after stepping over with LLE -Amb 168ft in clinic and to car in parking lot with SPC on L and pocketbook on R shoulder for practice with carrying objects in RUE with amb; with good carry over of descending curb mechanics in parking lot     PT Education - 03/17/20 1604    Education Details Therex form/mechanics; gait mechanics    Person(s) Educated Patient    Methods Explanation;Demonstration;Verbal cues    Comprehension Verbalized understanding;Returned demonstration            PT Short Term Goals - 02/18/20 1254      PT SHORT TERM GOAL #1   Title Pt will be independent with HEP in order to improve strength and balance in order to decrease fall risk and improve function at home and work.    Baseline 02/18/20 pt educated on HEP and given handout    Time 2    Period Weeks    Status New    Target Date 03/03/20             PT Long Term Goals - 02/18/20 1304      PT LONG TERM GOAL #1   Title Pt will increase FOTO score to 59 to indicate increased functional mobility for ADLs.    Baseline 02/18/20 FOTO 41    Time 8    Period Weeks    Status New    Target Date 04/14/20      PT LONG TERM GOAL #2   Title Pt will decrease worst hip pain as reported on NPRS by at least 2 points in order to demonstrate clinically  significant reduction in hip pain.    Baseline 02/18/20 Worst 8/10    Time 8    Period Weeks    Status New    Target Date 04/14/20      PT LONG TERM  GOAL #3   Title Pt will decrease TUG to below 14 seconds in order to demonstrate decreased fall risk.    Baseline 02/18/20 TUG 24.7 sec    Time 8    Period Weeks    Status New    Target Date 04/14/20      PT LONG TERM GOAL #4   Title Pt will decrease 5xSTS to 15 seconds or less to match pt population age norms indicating increased LE strength and decreased fall risk.    Baseline 02/18/20 5xSTS 45 sec    Time 8    Period Weeks    Status New    Target Date 04/14/20      PT LONG TERM GOAL #5   Title Pt will ambulate with gait speed of 1.25m/s to demonstrate safe community ambulation.    Baseline 02/18/20 >1.77m/s not formally assessed, to be assessed next session    Time 8    Period Weeks    Status New    Target Date 04/14/20                 Plan - 03/17/20 1652    Clinical Impression Statement PT continued hip strengthening and gait training with good success.  Pt reports improvements with R hip and LE pain, and is able to better tolerate STM and therex with minimal increase in pain that subsides to baseline with cessation of activity.  Pt able to perform hip flexion onto 6" stair with reduced irritation of RLE/hip.  Pt requires unilateral UE support and supervision for therex, and CGA for gait activities with one instance of posterior stepping strategy for balance correction d/t improper SPC placement, able to correct independently with no other instances of balance errors post verbal cueing for [facility] coordination with stepping over objects.  PT will continue progression as able.    Personal Factors and Comorbidities Age;Comorbidity 1    Comorbidities HTN    Examination-Activity Limitations Bathing;Bed Mobility;Bend;Caring for Others;Carry;Dressing;Lift;Locomotion Level;Sit;Squat;Stairs;Stand;Toileting;Transfers     Examination-Participation Restrictions Cleaning;Laundry;Community Activity    Stability/Clinical Decision Making Evolving/Moderate complexity    Clinical Decision Making Moderate    Rehab Potential Good    PT Frequency 2x / week    PT Duration 8 weeks    PT Treatment/Interventions ADLs/Self Care Home Management;Electrical Stimulation;Therapeutic activities;Patient/family education;Energy conservation;Therapeutic exercise;Spinal Manipulations;Joint Manipulations;Biofeedback;Cryotherapy;Moist Heat;Traction;Gait training;Stair training;Functional mobility training;Neuromuscular re-education;Balance training;Manual techniques;Passive range of motion;Taping;Dry needling;DME Instruction    PT Next Visit Plan Progress low level hip flexion activities (stair tap, marching), core/glute strengthening, gait over obstacles    PT Home Exercise Plan Posterior pevlic tilts, seated walk out stretch on table, seated hip abd/ knee ext, sit to stands    Consulted and Agree with Plan of Care Patient           Patient will benefit from skilled therapeutic intervention in order to improve the following deficits and impairments:  Abnormal gait, Improper body mechanics, Pain, Decreased coordination, Decreased mobility, Decreased activity tolerance, Decreased endurance, Decreased range of motion, Decreased strength, Hypomobility, Decreased balance, Difficulty walking, Impaired flexibility  Visit Diagnosis: Pain in right hip  Chronic right-sided low back pain, unspecified whether sciatica present  Sacroiliac dysfunction     Problem List Patient Active Problem List   Diagnosis Date Noted  . Essential hypertension, benign 08/24/2016  . Splenic artery aneurysm ([place]) 08/24/2016    Durwin Reges DPT Chinita Greenland, SPT Durwin Reges 03/18/2020, 4:50 PM  [facility] [address]  [place], [place], [address] Phone: [phone]   Fax:   [phone]  Name: Traci Quinn MRN: [id number] Date of Birth: 1934/03/25

## 2020-03-18 ENCOUNTER — Encounter: Payer: Self-pay | Admitting: Physical Therapy

## 2020-03-19 ENCOUNTER — Other Ambulatory Visit: Payer: Self-pay

## 2020-03-19 ENCOUNTER — Encounter: Payer: Self-pay | Admitting: Physical Therapy

## 2020-03-19 ENCOUNTER — Ambulatory Visit: Payer: Medicare HMO | Admitting: Physical Therapy

## 2020-03-19 DIAGNOSIS — M25551 Pain in right hip: Secondary | ICD-10-CM

## 2020-03-19 DIAGNOSIS — M545 Low back pain, unspecified: Secondary | ICD-10-CM

## 2020-03-19 DIAGNOSIS — M533 Sacrococcygeal disorders, not elsewhere classified: Secondary | ICD-10-CM

## 2020-03-19 NOTE — Therapy (Signed)
[facility] [address] [place], [address] Phone: [phone]   Fax:  [phone]  Physical Therapy Treatment  Patient Details  Name: Traci Quinn MRN: [id number] Date of Birth: 20-May-1934 No data recorded  Encounter Date: 03/19/2020   PT End of Session - 03/19/20 0951    Visit Number 8    Number of Visits 16    Date for PT Re-Evaluation 04/14/20    PT Start Time 0947    PT Stop Time 1030    PT Time Calculation (min) 43 min    Equipment Utilized During Treatment Gait belt    Activity Tolerance Patient tolerated treatment well;No increased pain    Behavior During Therapy WFL for tasks assessed/performed           Past Medical History:  Diagnosis Date  . Arthritis   . Asthma   . Cancer [facility])    left sided breast cancer  . GERD (gastroesophageal reflux disease)   . Hypertension     Past Surgical History:  Procedure Laterality Date  . ABDOMINAL HYSTERECTOMY    . APPENDECTOMY    . BREAST EXCISIONAL BIOPSY Right 1970   neg surgical exc  . BREAST LUMPECTOMY Left 2000   rad and no chemo  . BREAST SURGERY     lumpectomy  . CHOLECYSTECTOMY    . ESOPHAGOGASTRODUODENOSCOPY (EGD) WITH PROPOFOL N/A 08/06/2016   Procedure: ESOPHAGOGASTRODUODENOSCOPY (EGD) WITH PROPOFOL;  Surgeon: Lollie Sails, MD;  Location: [facility] ENDOSCOPY;  Service: Endoscopy;  Laterality: N/A;    There were no vitals filed for this visit.   Subjective Assessment - 03/19/20 0949    Subjective Pt reports 2/10 pain in the R hip and occasional LE pain proximal to the knee with getting in and out of the car.  Pt reports getting a cortizone shot in her R hip yesterday and that the pain is better today.    Pertinent History Pt is an 86 y.o female presenting to PT with c/o of R hip and low back pain 4 weeks s/p fall onto R posterolateral hip.  Pt reports she lost her balance while picking up something off her couch and rotating to  place it on a table.  Pt reports this is the only fall she has had in the past 6 mo.  Pt reports aching pain with occasional sharp pain in R hip and R low back (Worst: 8/10, Best: 0/10, Current 4/10) with pain radiating into anterior thigh to anterolateral knee.  Pain is worse in the mornings, gets better as the day goes on, and is aggravated by lifting the R leg, standing from a seated position, stairs, and walking.  Pt reports no previous hx of falls, trauma, or hx of back/hip injury or pain.  Pt reports she had sx of sciatica isolated to the R low back prior to her fall with no prior tx.  Prior to injury, pt amb using a SPC on outdoor, uneven surfaces only; now is using the [facility] any time she leaves her home.  Pt lives in a single story home with her 97 y.o. great grandson of whom she is the primary caregiver; pt reports her daughter also lives with her but she works and is only home in the late evening.  Pt has a ramp entry that she uses, and avoids using other entrances in her home with stairs d/t difficulty with ascending/descending stairs.  Pt also reports difficulty with completing household chores  d/t hip pain.  Pt goals with therapy are to be able to lift RLE and reduce pain.  Pt reported PMH of breast cancer that is in remission, HTN, and asthma.  Pt denies N/V, B&B changes, unexplained weight fluctuation, saddle paresthesia, fever, night sweats, or unrelenting night pain at this time.    Limitations Sitting;Lifting;Standing;Walking;House hold activities    How long can you sit comfortably? 15 min    How long can you stand comfortably? 10 min    How long can you walk comfortably? 1-2 min    Patient Stated Goals To be able to lift RLE and reduce hip/back pain    Currently in Pain? Yes    Pain Score 2     Pain Location Hip    Pain Orientation Right    Pain Onset 1 to 4 weeks ago           TherEx -Obstacle course x4 with step up/over foam and step over 5x3" hurdles, amb 58ft with each rep; with  cueing for [facility] coordination with good carry over -Obstacle course x4 with step over 5x3" hurdles onto foam, amb 30 ft with each rep; with cueing for [facility] coordination with navigating obstacles on/off foam, and coordination of SPC with RLE step over with good carry over, no LOB and supervision for safety -Lateral step over 3" hurdle 2x8 with cueing for controlled motion for RLE follow through step, pt noted decreased pain with more controlled movement with reduced trunk flexion compensation -Lateral step up onto 3" step 2x10 with cueing for hip hinge and glute activation     PT Education - 03/19/20 0950    Education Details Therex form/mechanics; gait mechanics    Person(s) Educated Patient    Methods Explanation;Demonstration;Verbal cues    Comprehension Verbalized understanding;Returned demonstration            PT Short Term Goals - 02/18/20 1254      PT SHORT TERM GOAL #1   Title Pt will be independent with HEP in order to improve strength and balance in order to decrease fall risk and improve function at home and work.    Baseline 02/18/20 pt educated on HEP and given handout    Time 2    Period Weeks    Status New    Target Date 03/03/20             PT Long Term Goals - 02/18/20 1304      PT LONG TERM GOAL #1   Title Pt will increase FOTO score to 59 to indicate increased functional mobility for ADLs.    Baseline 02/18/20 FOTO 41    Time 8    Period Weeks    Status New    Target Date 04/14/20      PT LONG TERM GOAL #2   Title Pt will decrease worst hip pain as reported on NPRS by at least 2 points in order to demonstrate clinically significant reduction in hip pain.    Baseline 02/18/20 Worst 8/10    Time 8    Period Weeks    Status New    Target Date 04/14/20      PT LONG TERM GOAL #3   Title Pt will decrease TUG to below 14 seconds in order to demonstrate decreased fall risk.    Baseline 02/18/20 TUG 24.7 sec    Time 8    Period Weeks    Status New    Target  Date 04/14/20  PT LONG TERM GOAL #4   Title Pt will decrease 5xSTS to 15 seconds or less to match pt population age norms indicating increased LE strength and decreased fall risk.    Baseline 02/18/20 5xSTS 45 sec    Time 8    Period Weeks    Status New    Target Date 04/14/20      PT LONG TERM GOAL #5   Title Pt will ambulate with gait speed of 1.20m/s to demonstrate safe community ambulation.    Baseline 02/18/20 >1.51m/s not formally assessed, to be assessed next session    Time 8    Period Weeks    Status New    Target Date 04/14/20                 Plan - 03/19/20 1044    Clinical Impression Statement PT continued hip strengthening and gait training with good success and no increase in pain.  Pt with continued improvements in R hip pain, and occasional RLE pain with car transfers and hip flexion activities.  Pt spent majority of session on her feet with amb and active rest breaks between sets.  Pt requires supervision for gait and balance therex with no LOB and appropriate stepping strategies for minimal balance errors.  Pt with improved coordination of SPC with navigating obstacles and good carry over.  Pt with improved R knee pain with hip flexion after cueing for controlled hip flexion motion with reduced trunk flexion momentum compensation.  PT will continue progression able.    Personal Factors and Comorbidities Age;Comorbidity 1    Comorbidities HTN    Examination-Activity Limitations Bathing;Bed Mobility;Bend;Caring for Others;Carry;Dressing;Lift;Locomotion Level;Sit;Squat;Stairs;Stand;Toileting;Transfers    Examination-Participation Restrictions Cleaning;Laundry;Community Activity    Stability/Clinical Decision Making Evolving/Moderate complexity    Clinical Decision Making Moderate    Rehab Potential Good    PT Frequency 2x / week    PT Duration 8 weeks    PT Treatment/Interventions ADLs/Self Care Home Management;Electrical Stimulation;Therapeutic  activities;Patient/family education;Energy conservation;Therapeutic exercise;Spinal Manipulations;Joint Manipulations;Biofeedback;Cryotherapy;Moist Heat;Traction;Gait training;Stair training;Functional mobility training;Neuromuscular re-education;Balance training;Manual techniques;Passive range of motion;Taping;Dry needling;DME Instruction    PT Next Visit Plan Progress low level hip flexion activities (stair tap, marching), core/glute strengthening, gait over obstacles    PT Home Exercise Plan Posterior pevlic tilts, seated walk out stretch on table, seated hip abd/ knee ext, sit to stands    Consulted and Agree with Plan of Care Patient           Patient will benefit from skilled therapeutic intervention in order to improve the following deficits and impairments:  Abnormal gait, Improper body mechanics, Pain, Decreased coordination, Decreased mobility, Decreased activity tolerance, Decreased endurance, Decreased range of motion, Decreased strength, Hypomobility, Decreased balance, Difficulty walking, Impaired flexibility  Visit Diagnosis: Pain in right hip  Chronic right-sided low back pain, unspecified whether sciatica present  Sacroiliac dysfunction     Problem List Patient Active Problem List   Diagnosis Date Noted  . Essential hypertension, benign 08/24/2016  . Splenic artery aneurysm ([place]) 08/24/2016    Durwin Reges DPT Chinita Greenland, SPT Chinita Greenland 03/19/2020, 11:25 AM  [facility] [address], [place], [address] Phone: [phone]   Fax:  [phone]  Name: DANYLA WATTLEY MRN: [id number] Date of Birth: [birth year]

## 2020-03-24 ENCOUNTER — Ambulatory Visit: Payer: Medicare HMO | Attending: Sports Medicine | Admitting: Physical Therapy

## 2020-03-24 ENCOUNTER — Other Ambulatory Visit: Payer: Self-pay

## 2020-03-24 ENCOUNTER — Encounter: Payer: Self-pay | Admitting: Physical Therapy

## 2020-03-24 DIAGNOSIS — M533 Sacrococcygeal disorders, not elsewhere classified: Secondary | ICD-10-CM | POA: Diagnosis present

## 2020-03-24 DIAGNOSIS — M25551 Pain in right hip: Secondary | ICD-10-CM

## 2020-03-24 DIAGNOSIS — M545 Low back pain: Secondary | ICD-10-CM | POA: Insufficient documentation

## 2020-03-24 DIAGNOSIS — G8929 Other chronic pain: Secondary | ICD-10-CM | POA: Diagnosis present

## 2020-03-24 NOTE — Therapy (Signed)
[facility] [address] [place], [address] Phone: [phone]   Fax:  [phone]  Physical Therapy Treatment  Patient Details  Name: Traci Quinn MRN: [id number] Date of Birth: 12-05-1933 No data recorded  Encounter Date: 03/24/2020   PT End of Session - 03/24/20 0905    Visit Number 9    Number of Visits 16    Date for PT Re-Evaluation 04/14/20    PT Start Time 0900    PT Stop Time 0945    PT Time Calculation (min) 45 min    Equipment Utilized During Treatment Gait belt    Activity Tolerance Patient tolerated treatment well;No increased pain    Behavior During Therapy WFL for tasks assessed/performed           Past Medical History:  Diagnosis Date  . Arthritis   . Asthma   . Cancer [facility])    left sided breast cancer  . GERD (gastroesophageal reflux disease)   . Hypertension     Past Surgical History:  Procedure Laterality Date  . ABDOMINAL HYSTERECTOMY    . APPENDECTOMY    . BREAST EXCISIONAL BIOPSY Right 1970   neg surgical exc  . BREAST LUMPECTOMY Left 2000   rad and no chemo  . BREAST SURGERY     lumpectomy  . CHOLECYSTECTOMY    . ESOPHAGOGASTRODUODENOSCOPY (EGD) WITH PROPOFOL N/A 08/06/2016   Procedure: ESOPHAGOGASTRODUODENOSCOPY (EGD) WITH PROPOFOL;  Surgeon: Lollie Sails, MD;  Location: [facility] ENDOSCOPY;  Service: Endoscopy;  Laterality: N/A;    There were no vitals filed for this visit.   Subjective Assessment - 03/24/20 0901    Subjective Pt reports her hip is feeling much better after her injection. She reports that she has no pain this morning, unless she moves a certain way. Has noticed she has some difficulty lifting her RLE to get in the car, and to place her foot on a stool.    Pertinent History Pt is an 86 y.o female presenting to PT with c/o of R hip and low back pain 4 weeks s/p fall onto R posterolateral hip.  Pt reports she lost her balance while picking up  something off her couch and rotating to place it on a table.  Pt reports this is the only fall she has had in the past 6 mo.  Pt reports aching pain with occasional sharp pain in R hip and R low back (Worst: 8/10, Best: 0/10, Current 4/10) with pain radiating into anterior thigh to anterolateral knee.  Pain is worse in the mornings, gets better as the day goes on, and is aggravated by lifting the R leg, standing from a seated position, stairs, and walking.  Pt reports no previous hx of falls, trauma, or hx of back/hip injury or pain.  Pt reports she had sx of sciatica isolated to the R low back prior to her fall with no prior tx.  Prior to injury, pt amb using a SPC on outdoor, uneven surfaces only; now is using the [facility] any time she leaves her home.  Pt lives in a single story home with her 16 y.o. great grandson of whom she is the primary caregiver; pt reports her daughter also lives with her but she works and is only home in the late evening.  Pt has a ramp entry that she uses, and avoids using other entrances in her home with stairs d/t difficulty with ascending/descending stairs.  Pt also reports  difficulty with completing household chores d/t hip pain.  Pt goals with therapy are to be able to lift RLE and reduce pain.  Pt reported PMH of breast cancer that is in remission, HTN, and asthma.  Pt denies N/V, B&B changes, unexplained weight fluctuation, saddle paresthesia, fever, night sweats, or unrelenting night pain at this time.    Limitations Sitting;Lifting;Standing;Walking;House hold activities    How long can you sit comfortably? 15 min    How long can you stand comfortably? 10 min    How long can you walk comfortably? 1-2 min    Patient Stated Goals To be able to lift RLE and reduce hip/back pain    Pain Onset 1 to 4 weeks ago           TherEx Nustep L2 seat 7 UE 8 60mins with min cuing to maintain SPM >50 - Standing R hip flex 2x 10; with RLE placement on foot stool x10 with min cuing to "lift  foot" from stool to lower with good carry over - RLE step ups onto 8in step x10 with min cuing for hip ext with good carry over - Mini squat with light tap onto elevated mat table with heavy cuing for full hip ext with good carry over Dynamic Balance: 164ft with horizontal/vertical head turns with CGA for safety 171ft with ball retrieval and give away with trunk/head turns with heavy R hip drop without cane - Standing hip abd 3x 6 bilat with bilat UE support, cuing initially for proper technique with good carry over  HEP update Exercises Seated Child's Pose with Table - 1 x daily - 7 x weekly - 3 sets - 10 reps Sit to Stand - 1 x daily - 3 x weekly - 3 sets - 5-10 reps Supine Bridge - 1 x daily - 3 x weekly - 3 sets - 10 reps Standing Hip Abduction with Counter Support - 1 x daily - 3 x weekly - 3 sets - 5-10 reps                          PT Education - 03/24/20 0906    Education Details therex form/technique    Person(s) Educated Patient    Methods Explanation;Demonstration;Verbal cues    Comprehension Verbalized understanding;Returned demonstration;Verbal cues required            PT Short Term Goals - 02/18/20 1254      PT SHORT TERM GOAL #1   Title Pt will be independent with HEP in order to improve strength and balance in order to decrease fall risk and improve function at home and work.    Baseline 02/18/20 pt educated on HEP and given handout    Time 2    Period Weeks    Status New    Target Date 03/03/20             PT Long Term Goals - 02/18/20 1304      PT LONG TERM GOAL #1   Title Pt will increase FOTO score to 59 to indicate increased functional mobility for ADLs.    Baseline 02/18/20 FOTO 41    Time 8    Period Weeks    Status New    Target Date 04/14/20      PT LONG TERM GOAL #2   Title Pt will decrease worst hip pain as reported on NPRS by at least 2 points in order to demonstrate clinically significant reduction in hip  pain.     Baseline 02/18/20 Worst 8/10    Time 8    Period Weeks    Status New    Target Date 04/14/20      PT LONG TERM GOAL #3   Title Pt will decrease TUG to below 14 seconds in order to demonstrate decreased fall risk.    Baseline 02/18/20 TUG 24.7 sec    Time 8    Period Weeks    Status New    Target Date 04/14/20      PT LONG TERM GOAL #4   Title Pt will decrease 5xSTS to 15 seconds or less to match pt population age norms indicating increased LE strength and decreased fall risk.    Baseline 02/18/20 5xSTS 45 sec    Time 8    Period Weeks    Status New    Target Date 04/14/20      PT LONG TERM GOAL #5   Title Pt will ambulate with gait speed of 1.34m/s to demonstrate safe community ambulation.    Baseline 02/18/20 >1.95m/s not formally assessed, to be assessed next session    Time 8    Period Weeks    Status New    Target Date 04/14/20                 Plan - 03/24/20 0946    Clinical Impression Statement PT continued therex progression for increased R hip strength and stability, and dynamic balance to reduce fall risk with good success. Patient is able to comply with all cuing for safety and technique with good motivation and no increased pain throughout session. PT will continue progression as able.    Personal Factors and Comorbidities Age;Comorbidity 1    Comorbidities HTN    Examination-Activity Limitations Bathing;Bed Mobility;Bend;Caring for Others;Carry;Dressing;Lift;Locomotion Level;Sit;Squat;Stairs;Stand;Toileting;Transfers    Examination-Participation Restrictions Cleaning;Laundry;Community Activity    Stability/Clinical Decision Making Evolving/Moderate complexity    Clinical Decision Making Moderate    Rehab Potential Good    PT Frequency 2x / week    PT Duration 8 weeks    PT Treatment/Interventions ADLs/Self Care Home Management;Electrical Stimulation;Therapeutic activities;Patient/family education;Energy conservation;Therapeutic exercise;Spinal  Manipulations;Joint Manipulations;Biofeedback;Cryotherapy;Moist Heat;Traction;Gait training;Stair training;Functional mobility training;Neuromuscular re-education;Balance training;Manual techniques;Passive range of motion;Taping;Dry needling;DME Instruction    PT Next Visit Plan Progress low level hip flexion activities (stair tap, marching), core/glute strengthening, gait over obstacles    PT Home Exercise Plan Posterior pevlic tilts, seated walk out stretch on table, seated hip abd/ knee ext, sit to stands    Consulted and Agree with Plan of Care Patient           Patient will benefit from skilled therapeutic intervention in order to improve the following deficits and impairments:  Abnormal gait, Improper body mechanics, Pain, Decreased coordination, Decreased mobility, Decreased activity tolerance, Decreased endurance, Decreased range of motion, Decreased strength, Hypomobility, Decreased balance, Difficulty walking, Impaired flexibility  Visit Diagnosis: Pain in right hip  Chronic right-sided low back pain, unspecified whether sciatica present  Sacroiliac dysfunction     Problem List Patient Active Problem List   Diagnosis Date Noted  . Essential hypertension, benign 08/24/2016  . Splenic artery aneurysm ([place]) 08/24/2016   Durwin Reges DPT Durwin Reges 03/24/2020, 9:50 AM  [facility] [address], [place], [address] Phone: [phone]   Fax:  [phone]  Name: Traci Quinn MRN: [id number] Date of Birth: [birth year]

## 2020-03-27 ENCOUNTER — Ambulatory Visit: Payer: Medicare HMO | Admitting: Physical Therapy

## 2020-03-27 ENCOUNTER — Other Ambulatory Visit: Payer: Self-pay

## 2020-03-27 ENCOUNTER — Encounter: Payer: Self-pay | Admitting: Physical Therapy

## 2020-03-27 DIAGNOSIS — G8929 Other chronic pain: Secondary | ICD-10-CM

## 2020-03-27 DIAGNOSIS — M533 Sacrococcygeal disorders, not elsewhere classified: Secondary | ICD-10-CM

## 2020-03-27 DIAGNOSIS — M25551 Pain in right hip: Secondary | ICD-10-CM | POA: Diagnosis not present

## 2020-03-27 NOTE — Therapy (Signed)
[facility] [address] [place], [address] Phone: [phone]   Fax:  [phone]  Physical Therapy Treatment/Progress Note Reporting Period: 02/18/20 - 03/27/20  Patient Details  Name: Traci Quinn MRN: [id number] Date of Birth: 1934/03/12 No data recorded  Encounter Date: 03/27/2020   PT End of Session - 03/27/20 1040    Visit Number 10    Number of Visits 16    Date for PT Re-Evaluation 04/14/20    PT Start Time 6712    PT Stop Time 1128    PT Time Calculation (min) 53 min    Equipment Utilized During Treatment Gait belt    Activity Tolerance Patient tolerated treatment well;No increased pain    Behavior During Therapy WFL for tasks assessed/performed           Past Medical History:  Diagnosis Date  . Arthritis   . Asthma   . Cancer [facility])    left sided breast cancer  . GERD (gastroesophageal reflux disease)   . Hypertension     Past Surgical History:  Procedure Laterality Date  . ABDOMINAL HYSTERECTOMY    . APPENDECTOMY    . BREAST EXCISIONAL BIOPSY Right 1970   neg surgical exc  . BREAST LUMPECTOMY Left 2000   rad and no chemo  . BREAST SURGERY     lumpectomy  . CHOLECYSTECTOMY    . ESOPHAGOGASTRODUODENOSCOPY (EGD) WITH PROPOFOL N/A 08/06/2016   Procedure: ESOPHAGOGASTRODUODENOSCOPY (EGD) WITH PROPOFOL;  Surgeon: Lollie Sails, MD;  Location: [facility] ENDOSCOPY;  Service: Endoscopy;  Laterality: N/A;    There were no vitals filed for this visit.   Subjective Assessment - 03/27/20 1039    Subjective Patient reports her hip is feeling better overall, 2/10 pain today. Reports she still has pain getting in/out of car. Compliance with HEP.    Pertinent History Pt is an 86 y.o female presenting to PT with c/o of R hip and low back pain 4 weeks s/p fall onto R posterolateral hip.  Pt reports she lost her balance while picking up something off her couch and rotating to place it on a  table.  Pt reports this is the only fall she has had in the past 6 mo.  Pt reports aching pain with occasional sharp pain in R hip and R low back (Worst: 8/10, Best: 0/10, Current 4/10) with pain radiating into anterior thigh to anterolateral knee.  Pain is worse in the mornings, gets better as the day goes on, and is aggravated by lifting the R leg, standing from a seated position, stairs, and walking.  Pt reports no previous hx of falls, trauma, or hx of back/hip injury or pain.  Pt reports she had sx of sciatica isolated to the R low back prior to her fall with no prior tx.  Prior to injury, pt amb using a SPC on outdoor, uneven surfaces only; now is using the [facility] any time she leaves her home.  Pt lives in a single story home with her 55 y.o. great grandson of whom she is the primary caregiver; pt reports her daughter also lives with her but she works and is only home in the late evening.  Pt has a ramp entry that she uses, and avoids using other entrances in her home with stairs d/t difficulty with ascending/descending stairs.  Pt also reports difficulty with completing household chores d/t hip pain.  Pt goals with therapy are to be able to lift  RLE and reduce pain.  Pt reported PMH of breast cancer that is in remission, HTN, and asthma.  Pt denies N/V, B&B changes, unexplained weight fluctuation, saddle paresthesia, fever, night sweats, or unrelenting night pain at this time.    Limitations Sitting;Lifting;Standing;Walking;House hold activities    How long can you sit comfortably? 15 min    How long can you stand comfortably? 10 min    How long can you walk comfortably? 1-2 min    Patient Stated Goals To be able to lift RLE and reduce hip/back pain    Pain Onset 1 to 4 weeks ago           TherEx Nustep L2 seat 7 UE 8 65mins with min cuing to maintain SPM >50 - 5xSTS x2 trials, each under 15sec - TUG dynamic balance x2 trials  Education on results from objective measures with encouragement of  progress and strength and decrease pain, and remaining deficits in dynamic balance and gait speed/safety with community ambulation. Education on components of dynamic balance and functional movement with need of strength/power, balance, and coordination/motor control. Patient verbalizes understanding and HEP update accordingly PT reviewed the following HEP with patient with patient able to demonstrate a set of the following with min cuing for correction needed. PT educated patient on parameters of therex (how/when to inc/decrease intensity, frequency, rep/set range, stretch hold time, and purpose of therex) with verbalized understanding.  Access Code: [email or web address] Seated Child's Pose with Table - 3 x daily - 7 x weekly - 30sec hold Sit to Stand - 1 x daily - 2-3 x weekly - 3 sets - 10 reps Standing Hip Abduction with Counter Support - 1 x daily - 2-3 x weekly - 3 sets - 10 reps Seated March - 1 x daily - 2-3 x weekly - 3 sets - 10 reps Supine Bridge - 1 x daily - 2-3 x weekly - 3 sets - 10 reps  - Mini squat with elevated mat for TC 3x 10 with cuing bilat UE flex for increased core activation with excellent carry over Dynamic Balance: 196ft with speed changes at random with CGA for safety,  15ft 8 hurdles 4x LLE step over 4x RLE step over with difficulty with LLE step over with decreased stability with RLE WB  - Standing L SLS with min BUE support 2x 10sec hold; unable with full BUE support; education on lateral stability needed for SLS                           PT Education - 03/27/20 1040    Education Details therex/technique    Person(s) Educated Patient    Methods Explanation;Demonstration;Verbal cues    Comprehension Verbalized understanding;Verbal cues required;Returned demonstration            PT Short Term Goals - 03/27/20 1043      PT SHORT TERM GOAL #1   Title Pt will be independent with HEP in order to improve strength and balance in order to decrease fall  risk and improve function at home and work.    Baseline 03/27/20 Completing regularly 02/18/20 pt educated on HEP and given handout    Time 2    Period Weeks    Status Achieved             PT Long Term Goals - 03/27/20 1043      PT LONG TERM GOAL #1   Title Pt will increase FOTO score to  59 to indicate increased functional mobility for ADLs.    Baseline 03/27/20 49 02/18/20 FOTO 41    Time 8    Period Weeks    Status On-going      PT LONG TERM GOAL #2   Title Pt will decrease worst hip pain as reported on NPRS by at least 2 points in order to demonstrate clinically significant reduction in hip pain.    Baseline 03/27/20 5/10 02/18/20 Worst 8/10    Time 8    Period Weeks    Status Achieved      PT LONG TERM GOAL #3   Title Pt will decrease TUG to below 14 seconds in order to demonstrate decreased fall risk.    Baseline 03/26/20 14sec 02/18/20 TUG 24.7 sec    Time 8    Period Weeks    Status On-going      PT LONG TERM GOAL #4   Title Pt will decrease 5xSTS to 15 seconds or less to match pt population age norms indicating increased LE strength and decreased fall risk.    Baseline 03/27/20 14.8sec 02/18/20 5xSTS 45 sec    Time 8    Period Weeks    Status Achieved      PT LONG TERM GOAL #5   Title Pt will ambulate with gait speed of 1.76m/s to demonstrate safe community ambulation.    Baseline 03/26/20 0.66m/s 02/18/20 >1.29m/s not formally assessed, to be assessed next session    Time 8    Period Weeks    Status On-going                 Plan - 03/27/20 1134    Clinical Impression Statement PT re-assessed goals this session per Medicare compliance. Patient is making progress toward all goals, with clinically significant decrease in pain, increase in LE strength. Patient with remaining deficits in functional movements, ind transfers, and dynamic/static balance. PT updated HEP to reflect progress with patient able to demonstrate and verbalize understanding of all education and review  of progress/remaining deficits. PT will continue progression as able.    Examination-Activity Limitations Bathing;Bed Mobility;Bend;Caring for Others;Carry;Dressing;Lift;Locomotion Level;Sit;Squat;Stairs;Stand;Toileting;Transfers    Examination-Participation Restrictions Cleaning;Laundry;Community Activity    Stability/Clinical Decision Making Evolving/Moderate complexity    Clinical Decision Making Moderate    Rehab Potential Good    PT Frequency 2x / week    PT Duration 8 weeks    PT Treatment/Interventions ADLs/Self Care Home Management;Electrical Stimulation;Therapeutic activities;Patient/family education;Energy conservation;Therapeutic exercise;Spinal Manipulations;Joint Manipulations;Biofeedback;Cryotherapy;Moist Heat;Traction;Gait training;Stair training;Functional mobility training;Neuromuscular re-education;Balance training;Manual techniques;Passive range of motion;Taping;Dry needling;DME Instruction    PT Next Visit Plan Progress low level hip flexion activities (stair tap, marching), core/glute strengthening, gait over obstacles    PT Home Exercise Plan Posterior pevlic tilts, seated walk out stretch on table, seated hip abd/ knee ext, sit to stands    Consulted and Agree with Plan of Care Patient           Patient will benefit from skilled therapeutic intervention in order to improve the following deficits and impairments:  Abnormal gait, Improper body mechanics, Pain, Decreased coordination, Decreased mobility, Decreased activity tolerance, Decreased endurance, Decreased range of motion, Decreased strength, Hypomobility, Decreased balance, Difficulty walking, Impaired flexibility  Visit Diagnosis: Pain in right hip  Chronic right-sided low back pain, unspecified whether sciatica present  Sacroiliac dysfunction     Problem List Patient Active Problem List   Diagnosis Date Noted  . Essential hypertension, benign 08/24/2016  . Splenic artery aneurysm ([place]) 08/24/2016  Durwin Reges DPT Durwin Reges 03/27/2020, 11:38 AM  [facility] [address], [place], [address] Phone: [phone]   Fax:  [phone]  Name: SARAHI BORLAND MRN: [id number] Date of Birth: Oct 28, 1933

## 2020-03-31 ENCOUNTER — Other Ambulatory Visit: Payer: Self-pay

## 2020-03-31 ENCOUNTER — Ambulatory Visit: Payer: Medicare HMO | Admitting: Physical Therapy

## 2020-03-31 DIAGNOSIS — M25551 Pain in right hip: Secondary | ICD-10-CM | POA: Diagnosis not present

## 2020-03-31 DIAGNOSIS — M533 Sacrococcygeal disorders, not elsewhere classified: Secondary | ICD-10-CM

## 2020-03-31 DIAGNOSIS — G8929 Other chronic pain: Secondary | ICD-10-CM

## 2020-03-31 NOTE — Therapy (Signed)
[facility] [address], [place], [address] Phone: [phone]   Fax:  [phone]  Physical Therapy Treatment  Patient Details  Name: Traci Quinn MRN: [id number] Date of Birth: 1934-06-08 No data recorded  Encounter Date: 03/31/2020   PT End of Session - 03/31/20 0932    Visit Number 11    Number of Visits 16    Date for PT Re-Evaluation 04/14/20    Equipment Utilized During Treatment Gait belt    Activity Tolerance Patient tolerated treatment well;No increased pain    Behavior During Therapy WFL for tasks assessed/performed           Past Medical History:  Diagnosis Date  . Arthritis   . Asthma   . Cancer [facility])    left sided breast cancer  . GERD (gastroesophageal reflux disease)   . Hypertension     Past Surgical History:  Procedure Laterality Date  . ABDOMINAL HYSTERECTOMY    . APPENDECTOMY    . BREAST EXCISIONAL BIOPSY Right 1970   neg surgical exc  . BREAST LUMPECTOMY Left 2000   rad and no chemo  . BREAST SURGERY     lumpectomy  . CHOLECYSTECTOMY    . ESOPHAGOGASTRODUODENOSCOPY (EGD) WITH PROPOFOL N/A 08/06/2016   Procedure: ESOPHAGOGASTRODUODENOSCOPY (EGD) WITH PROPOFOL;  Surgeon: Lollie Sails, MD;  Location: [facility] ENDOSCOPY;  Service: Endoscopy;  Laterality: N/A;    There were no vitals filed for this visit.   Subjective Assessment - 03/31/20 0906    Subjective Reports her hip is feeling better overall, 2/10 pain today, though she does have some pain with certain movements, still trouble getting in and out of the car    Pertinent History Pt is an 86 y.o female presenting to PT with c/o of R hip and low back pain 4 weeks s/p fall onto R posterolateral hip.  Pt reports she lost her balance while picking up something off her couch and rotating to place it on a table.  Pt reports this is the only fall she has had in the past 6 mo.  Pt reports aching pain with occasional sharp  pain in R hip and R low back (Worst: 8/10, Best: 0/10, Current 4/10) with pain radiating into anterior thigh to anterolateral knee.  Pain is worse in the mornings, gets better as the day goes on, and is aggravated by lifting the R leg, standing from a seated position, stairs, and walking.  Pt reports no previous hx of falls, trauma, or hx of back/hip injury or pain.  Pt reports she had sx of sciatica isolated to the R low back prior to her fall with no prior tx.  Prior to injury, pt amb using a SPC on outdoor, uneven surfaces only; now is using the [facility] any time she leaves her home.  Pt lives in a single story home with her 15 y.o. great grandson of whom she is the primary caregiver; pt reports her daughter also lives with her but she works and is only home in the late evening.  Pt has a ramp entry that she uses, and avoids using other entrances in her home with stairs d/t difficulty with ascending/descending stairs.  Pt also reports difficulty with completing household chores d/t hip pain.  Pt goals with therapy are to be able to lift RLE and reduce pain.  Pt reported PMH of breast cancer that is in remission, HTN, and asthma.  Pt denies N/V, B&B changes,  unexplained weight fluctuation, saddle paresthesia, fever, night sweats, or unrelenting night pain at this time.    Limitations Sitting;Lifting;Standing;Walking;House hold activities    How long can you sit comfortably? 15 min    How long can you stand comfortably? 10 min    How long can you walk comfortably? 1-2 min    Patient Stated Goals To be able to lift RLE and reduce hip/back pain    Pain Onset 1 to 4 weeks ago           TherEx Nustep L3 seat 7 UE 8 1mins with min cuing to maintain SPM >50 SLR x10; with 2# AW 2x 10 with min cuing to maintain knee ext with decent carry over Standing toe tap onto 6in cone x10; 2 stacked cones (about 8in) 2x 10 with cuing to "lift foot to remove" with good carry over RLE step up onto 6in step 2x 10 with cuing to  prevent LLE push off for increased RLE demand with decent carry over R SLS with light BUE support 30sec with good understanding of HEP 247ft with speed changes at random with CGA for safety  Therapeutic Activity Observed car transfer into SUV, patient uses UEs to aid in RLE lift into car, has pain with clearing RLE in/out of car when hip is passing 90d. Attempted modified version of this with maintained discomfort. Education on hip flexor stregthening and carry over of HEP seated hip flex to completing transfer; patient able to complete 10x with cuing needed for upright posture without post lean compensation                              PT Education - 03/31/20 0932    Education Details therex technique, car transfers    Person(s) Educated Patient    Methods Explanation;Demonstration;Verbal cues    Comprehension Verbalized understanding;Returned demonstration;Verbal cues required            PT Short Term Goals - 03/27/20 1043      PT SHORT TERM GOAL #1   Title Pt will be independent with HEP in order to improve strength and balance in order to decrease fall risk and improve function at home and work.    Baseline 03/27/20 Completing regularly 02/18/20 pt educated on HEP and given handout    Time 2    Period Weeks    Status Achieved             PT Long Term Goals - 03/27/20 1043      PT LONG TERM GOAL #1   Title Pt will increase FOTO score to 59 to indicate increased functional mobility for ADLs.    Baseline 03/27/20 49 02/18/20 FOTO 41    Time 8    Period Weeks    Status On-going      PT LONG TERM GOAL #2   Title Pt will decrease worst hip pain as reported on NPRS by at least 2 points in order to demonstrate clinically significant reduction in hip pain.    Baseline 03/27/20 5/10 02/18/20 Worst 8/10    Time 8    Period Weeks    Status Achieved      PT LONG TERM GOAL #3   Title Pt will decrease TUG to below 14 seconds in order to demonstrate decreased fall  risk.    Baseline 03/26/20 14sec 02/18/20 TUG 24.7 sec    Time 8    Period Weeks  Status On-going      PT LONG TERM GOAL #4   Title Pt will decrease 5xSTS to 15 seconds or less to match pt population age norms indicating increased LE strength and decreased fall risk.    Baseline 03/27/20 14.8sec 02/18/20 5xSTS 45 sec    Time 8    Period Weeks    Status Achieved      PT LONG TERM GOAL #5   Title Pt will ambulate with gait speed of 1.97m/s to demonstrate safe community ambulation.    Baseline 03/26/20 0.38m/s 02/18/20 >1.19m/s not formally assessed, to be assessed next session    Time 8    Period Weeks    Status On-going                 Plan - 03/31/20 1013    Clinical Impression Statement PT continued therex progression for increased dynamic balance as well as hip/core strength needed to aid in ind, pain free car transfers with success. Patient is able to comply with all cuing for proper technique of therex and is motivated throughout session. Some occassional pain with active R hip flexion that subsides following. PT will continue progression as able.    Personal Factors and Comorbidities Age;Comorbidity 1    Comorbidities HTN    Examination-Activity Limitations Bathing;Bed Mobility;Bend;Caring for Others;Carry;Dressing;Lift;Locomotion Level;Sit;Squat;Stairs;Stand;Toileting;Transfers    Examination-Participation Restrictions Cleaning;Laundry;Community Activity    Stability/Clinical Decision Making Evolving/Moderate complexity    Clinical Decision Making Moderate    Rehab Potential Good    PT Frequency 2x / week    PT Duration 8 weeks    PT Treatment/Interventions ADLs/Self Care Home Management;Electrical Stimulation;Therapeutic activities;Patient/family education;Energy conservation;Therapeutic exercise;Spinal Manipulations;Joint Manipulations;Biofeedback;Cryotherapy;Moist Heat;Traction;Gait training;Stair training;Functional mobility training;Neuromuscular re-education;Balance  training;Manual techniques;Passive range of motion;Taping;Dry needling;DME Instruction    PT Next Visit Plan Progress low level hip flexion activities (stair tap, marching), core/glute strengthening, gait over obstacles    PT Home Exercise Plan Posterior pevlic tilts, seated walk out stretch on table, seated hip abd/ knee ext, sit to stands    Consulted and Agree with Plan of Care Patient           Patient will benefit from skilled therapeutic intervention in order to improve the following deficits and impairments:  Abnormal gait, Improper body mechanics, Pain, Decreased coordination, Decreased mobility, Decreased activity tolerance, Decreased endurance, Decreased range of motion, Decreased strength, Hypomobility, Decreased balance, Difficulty walking, Impaired flexibility  Visit Diagnosis: Pain in right hip  Chronic right-sided low back pain, unspecified whether sciatica present  Sacroiliac dysfunction     Problem List Patient Active Problem List   Diagnosis Date Noted  . Essential hypertension, benign 08/24/2016  . Splenic artery aneurysm ([place]) 08/24/2016   Durwin Reges DPT Durwin Reges 03/31/2020, 10:15 AM  [facility] [address] [place], [address] Phone: [phone]   Fax:  [phone]  Name: Traci Quinn MRN: [id number] Date of Birth: 1933-09-25

## 2020-04-03 ENCOUNTER — Ambulatory Visit: Payer: Medicare HMO | Admitting: Physical Therapy

## 2020-04-03 ENCOUNTER — Encounter: Payer: Self-pay | Admitting: Physical Therapy

## 2020-04-03 ENCOUNTER — Other Ambulatory Visit: Payer: Self-pay

## 2020-04-03 DIAGNOSIS — M25551 Pain in right hip: Secondary | ICD-10-CM

## 2020-04-03 DIAGNOSIS — M533 Sacrococcygeal disorders, not elsewhere classified: Secondary | ICD-10-CM

## 2020-04-03 DIAGNOSIS — G8929 Other chronic pain: Secondary | ICD-10-CM

## 2020-04-03 NOTE — Therapy (Signed)
[facility] [address] [place], [address] Phone: [phone]   Fax:  [phone]  Physical Therapy Treatment  Patient Details  Name: Traci Quinn MRN: [id number] Date of Birth: 1933-08-26 No data recorded  Encounter Date: 04/03/2020   PT End of Session - 04/03/20 0909    Visit Number 12    Number of Visits 16    Date for PT Re-Evaluation 04/14/20    PT Start Time 0900    PT Stop Time 0940    PT Time Calculation (min) 40 min    Equipment Utilized During Treatment Gait belt    Activity Tolerance Patient tolerated treatment well;No increased pain    Behavior During Therapy WFL for tasks assessed/performed           Past Medical History:  Diagnosis Date  . Arthritis   . Asthma   . Cancer [facility])    left sided breast cancer  . GERD (gastroesophageal reflux disease)   . Hypertension     Past Surgical History:  Procedure Laterality Date  . ABDOMINAL HYSTERECTOMY    . APPENDECTOMY    . BREAST EXCISIONAL BIOPSY Right 1970   neg surgical exc  . BREAST LUMPECTOMY Left 2000   rad and no chemo  . BREAST SURGERY     lumpectomy  . CHOLECYSTECTOMY    . ESOPHAGOGASTRODUODENOSCOPY (EGD) WITH PROPOFOL N/A 08/06/2016   Procedure: ESOPHAGOGASTRODUODENOSCOPY (EGD) WITH PROPOFOL;  Surgeon: Lollie Sails, MD;  Location: [facility] ENDOSCOPY;  Service: Endoscopy;  Laterality: N/A;    There were no vitals filed for this visit.   Subjective Assessment - 04/03/20 0901    Subjective Patient reports she is feeling pretty good overall, was a little stiff this am, but reports no pain this morning. Reports compliance with HEP.    Pertinent History Pt is an 86 y.o female presenting to PT with c/o of R hip and low back pain 4 weeks s/p fall onto R posterolateral hip.  Pt reports she lost her balance while picking up something off her couch and rotating to place it on a table.  Pt reports this is the only fall she has  had in the past 6 mo.  Pt reports aching pain with occasional sharp pain in R hip and R low back (Worst: 8/10, Best: 0/10, Current 4/10) with pain radiating into anterior thigh to anterolateral knee.  Pain is worse in the mornings, gets better as the day goes on, and is aggravated by lifting the R leg, standing from a seated position, stairs, and walking.  Pt reports no previous hx of falls, trauma, or hx of back/hip injury or pain.  Pt reports she had sx of sciatica isolated to the R low back prior to her fall with no prior tx.  Prior to injury, pt amb using a SPC on outdoor, uneven surfaces only; now is using the [facility] any time she leaves her home.  Pt lives in a single story home with her 29 y.o. great grandson of whom she is the primary caregiver; pt reports her daughter also lives with her but she works and is only home in the late evening.  Pt has a ramp entry that she uses, and avoids using other entrances in her home with stairs d/t difficulty with ascending/descending stairs.  Pt also reports difficulty with completing household chores d/t hip pain.  Pt goals with therapy are to be able to lift RLE and reduce pain.  Pt reported PMH of breast cancer that is in remission, HTN, and asthma.  Pt denies N/V, B&B changes, unexplained weight fluctuation, saddle paresthesia, fever, night sweats, or unrelenting night pain at this time.    Limitations Sitting;Lifting;Standing;Walking;House hold activities    How long can you sit comfortably? 15 min    How long can you stand comfortably? 10 min    How long can you walk comfortably? 1-2 min    Patient Stated Goals To be able to lift RLE and reduce hip/back pain    Pain Onset 1 to 4 weeks ago          TherEx Nustep L3 seat 7 UE 8 58mins with min cuing to maintain SPM >50 SLR x10; with 2# AW 2x 10 with min cuing to maintain knee ext with decent carry over Seated R hip flex 2x 10 with min cuing for upright posture without post lean Standing hip abd 2x 10 bilat  with BUE support, min cuing to prevent ER with RLE abd R SLS with light BUE support 30sec hold  Bridge 2x 10 with glute set prior (patient reports she got a hamstring cramp the other night), min cuing for even hip height with good carry over STS with attempt of light touch to mat table 2x 6/8 with cuing to prevent trunk rotation with good carry over OMEGA leg press 5# 3x 10 with min cuing initially to prevent hip IR/knee valgus with good carry over 112ft dynamic balance with speed changes called out randomly with good safety                             PT Education - 04/03/20 0908    Education Details therex form/technique    Person(s) Educated Patient    Methods Explanation;Demonstration;Verbal cues    Comprehension Verbalized understanding;Returned demonstration;Verbal cues required            PT Short Term Goals - 03/27/20 1043      PT SHORT TERM GOAL #1   Title Pt will be independent with HEP in order to improve strength and balance in order to decrease fall risk and improve function at home and work.    Baseline 03/27/20 Completing regularly 02/18/20 pt educated on HEP and given handout    Time 2    Period Weeks    Status Achieved             PT Long Term Goals - 03/27/20 1043      PT LONG TERM GOAL #1   Title Pt will increase FOTO score to 59 to indicate increased functional mobility for ADLs.    Baseline 03/27/20 49 02/18/20 FOTO 41    Time 8    Period Weeks    Status On-going      PT LONG TERM GOAL #2   Title Pt will decrease worst hip pain as reported on NPRS by at least 2 points in order to demonstrate clinically significant reduction in hip pain.    Baseline 03/27/20 5/10 02/18/20 Worst 8/10    Time 8    Period Weeks    Status Achieved      PT LONG TERM GOAL #3   Title Pt will decrease TUG to below 14 seconds in order to demonstrate decreased fall risk.    Baseline 03/26/20 14sec 02/18/20 TUG 24.7 sec    Time 8    Period Weeks    Status  On-going  PT LONG TERM GOAL #4   Title Pt will decrease 5xSTS to 15 seconds or less to match pt population age norms indicating increased LE strength and decreased fall risk.    Baseline 03/27/20 14.8sec 02/18/20 5xSTS 45 sec    Time 8    Period Weeks    Status Achieved      PT LONG TERM GOAL #5   Title Pt will ambulate with gait speed of 1.81m/s to demonstrate safe community ambulation.    Baseline 03/26/20 0.26m/s 02/18/20 >1.19m/s not formally assessed, to be assessed next session    Time 8    Period Weeks    Status On-going                 Plan - 04/03/20 9983    Clinical Impression Statement PT continued therex progression for increased dynamic balance and strengthening with good success. PT reviewed updated HEP with patient where patient needs min correction, with good carry over of all modifcations following multimodal cuing. Patient is able to comply with all cuing for proper technique of therex progression with good motivation throughout session, no increased pain. PT will continue progression as able.    Personal Factors and Comorbidities Age;Comorbidity 1    Comorbidities HTN    Examination-Activity Limitations Bathing;Bed Mobility;Bend;Caring for Others;Carry;Dressing;Lift;Locomotion Level;Sit;Squat;Stairs;Stand;Toileting;Transfers    Examination-Participation Restrictions Cleaning;Laundry;Community Activity    Stability/Clinical Decision Making Evolving/Moderate complexity    Clinical Decision Making Moderate    Rehab Potential Good    PT Frequency 2x / week    PT Duration 8 weeks    PT Treatment/Interventions ADLs/Self Care Home Management;Electrical Stimulation;Therapeutic activities;Patient/family education;Energy conservation;Therapeutic exercise;Spinal Manipulations;Joint Manipulations;Biofeedback;Cryotherapy;Moist Heat;Traction;Gait training;Stair training;Functional mobility training;Neuromuscular re-education;Balance training;Manual techniques;Passive range of  motion;Taping;Dry needling;DME Instruction    PT Next Visit Plan Progress low level hip flexion activities (stair tap, marching), core/glute strengthening, gait over obstacles    PT Home Exercise Plan seated walk out stretch on table, Standing hip abd    Consulted and Agree with Plan of Care Patient           Patient will benefit from skilled therapeutic intervention in order to improve the following deficits and impairments:  Abnormal gait, Improper body mechanics, Pain, Decreased coordination, Decreased mobility, Decreased activity tolerance, Decreased endurance, Decreased range of motion, Decreased strength, Hypomobility, Decreased balance, Difficulty walking, Impaired flexibility  Visit Diagnosis: Pain in right hip  Chronic right-sided low back pain, unspecified whether sciatica present  Sacroiliac dysfunction     Problem List Patient Active Problem List   Diagnosis Date Noted  . Essential hypertension, benign 08/24/2016  . Splenic artery aneurysm ([place]) 08/24/2016   Durwin Reges DPT Durwin Reges 04/03/2020, 9:39 AM  [facility] [address], [place], [address] Phone: [phone]   Fax:  [phone]  Name: Traci Quinn MRN: [id number] Date of Birth: [birth year]

## 2020-04-07 ENCOUNTER — Other Ambulatory Visit: Payer: Self-pay

## 2020-04-07 ENCOUNTER — Encounter: Payer: Self-pay | Admitting: Physical Therapy

## 2020-04-07 ENCOUNTER — Ambulatory Visit: Payer: Medicare HMO | Admitting: Physical Therapy

## 2020-04-07 DIAGNOSIS — G8929 Other chronic pain: Secondary | ICD-10-CM

## 2020-04-07 DIAGNOSIS — M533 Sacrococcygeal disorders, not elsewhere classified: Secondary | ICD-10-CM

## 2020-04-07 DIAGNOSIS — M25551 Pain in right hip: Secondary | ICD-10-CM

## 2020-04-07 NOTE — Therapy (Signed)
[facility] [address], [place], [address] Phone: [phone]   Fax:  [phone]  Physical Therapy Treatment  Patient Details  Name: Traci Quinn MRN: [id number] Date of Birth: 1934/06/25 No data recorded  Encounter Date: 04/07/2020   PT End of Session - 04/07/20 0952    Visit Number 13    Number of Visits 16    Date for PT Re-Evaluation 04/14/20    PT Start Time 0945    PT Stop Time 1030    PT Time Calculation (min) 45 min    Equipment Utilized During Treatment Gait belt           Past Medical History:  Diagnosis Date  . Arthritis   . Asthma   . Cancer [facility])    left sided breast cancer  . GERD (gastroesophageal reflux disease)   . Hypertension     Past Surgical History:  Procedure Laterality Date  . ABDOMINAL HYSTERECTOMY    . APPENDECTOMY    . BREAST EXCISIONAL BIOPSY Right 1970   neg surgical exc  . BREAST LUMPECTOMY Left 2000   rad and no chemo  . BREAST SURGERY     lumpectomy  . CHOLECYSTECTOMY    . ESOPHAGOGASTRODUODENOSCOPY (EGD) WITH PROPOFOL N/A 08/06/2016   Procedure: ESOPHAGOGASTRODUODENOSCOPY (EGD) WITH PROPOFOL;  Surgeon: Lollie Sails, MD;  Location: [facility] ENDOSCOPY;  Service: Endoscopy;  Laterality: N/A;    There were no vitals filed for this visit.   Subjective Assessment - 04/07/20 0950    Subjective Reports no pain today, and that she is having less pain getting in and out of a car. Reports she is doing well overall.    Pertinent History Pt is an 86 y.o female presenting to PT with c/o of R hip and low back pain 4 weeks s/p fall onto R posterolateral hip.  Pt reports she lost her balance while picking up something off her couch and rotating to place it on a table.  Pt reports this is the only fall she has had in the past 6 mo.  Pt reports aching pain with occasional sharp pain in R hip and R low back (Worst: 8/10, Best: 0/10, Current 4/10) with pain radiating  into anterior thigh to anterolateral knee.  Pain is worse in the mornings, gets better as the day goes on, and is aggravated by lifting the R leg, standing from a seated position, stairs, and walking.  Pt reports no previous hx of falls, trauma, or hx of back/hip injury or pain.  Pt reports she had sx of sciatica isolated to the R low back prior to her fall with no prior tx.  Prior to injury, pt amb using a SPC on outdoor, uneven surfaces only; now is using the [facility] any time she leaves her home.  Pt lives in a single story home with her 59 y.o. great grandson of whom she is the primary caregiver; pt reports her daughter also lives with her but she works and is only home in the late evening.  Pt has a ramp entry that she uses, and avoids using other entrances in her home with stairs d/t difficulty with ascending/descending stairs.  Pt also reports difficulty with completing household chores d/t hip pain.  Pt goals with therapy are to be able to lift RLE and reduce pain.  Pt reported PMH of breast cancer that is in remission, HTN, and asthma.  Pt denies N/V, B&B changes, unexplained weight  fluctuation, saddle paresthesia, fever, night sweats, or unrelenting night pain at this time.    Limitations Sitting;Lifting;Standing;Walking;House hold activities    How long can you sit comfortably? 15 min    How long can you stand comfortably? 10 min    How long can you walk comfortably? 1-2 min    Patient Stated Goals To be able to lift RLE and reduce hip/back pain    Pain Onset 1 to 4 weeks ago             TherEx Nustep L3seat 7 UE 8 55mins with min cuing to maintain SPM >50 SLR with 3# AW 3x 10 with min cuing to maintain knee ext with decent carry over Seated R hip flex 3x 10 with BUE out stretched to prevent post lean with good carry over L reverse lunge 3x 8 with cuing to "push through" R heel for increased RLE effort with good carry over RLE step up onto 8in step with cuing for hip ext and step up without  ankle ER compensation with decent carry over OMEGA leg press 10# 3x 10 with min cuing initially to prevent hip IR/knee valgus with good carry over                           PT Education - 04/07/20 0955    Education Details therex form/technique    Person(s) Educated Patient    Methods Explanation;Demonstration;Verbal cues    Comprehension Verbalized understanding;Returned demonstration;Verbal cues required            PT Short Term Goals - 03/27/20 1043      PT SHORT TERM GOAL #1   Title Pt will be independent with HEP in order to improve strength and balance in order to decrease fall risk and improve function at home and work.    Baseline 03/27/20 Completing regularly 02/18/20 pt educated on HEP and given handout    Time 2    Period Weeks    Status Achieved             PT Long Term Goals - 03/27/20 1043      PT LONG TERM GOAL #1   Title Pt will increase FOTO score to 59 to indicate increased functional mobility for ADLs.    Baseline 03/27/20 49 02/18/20 FOTO 41    Time 8    Period Weeks    Status On-going      PT LONG TERM GOAL #2   Title Pt will decrease worst hip pain as reported on NPRS by at least 2 points in order to demonstrate clinically significant reduction in hip pain.    Baseline 03/27/20 5/10 02/18/20 Worst 8/10    Time 8    Period Weeks    Status Achieved      PT LONG TERM GOAL #3   Title Pt will decrease TUG to below 14 seconds in order to demonstrate decreased fall risk.    Baseline 03/26/20 14sec 02/18/20 TUG 24.7 sec    Time 8    Period Weeks    Status On-going      PT LONG TERM GOAL #4   Title Pt will decrease 5xSTS to 15 seconds or less to match pt population age norms indicating increased LE strength and decreased fall risk.    Baseline 03/27/20 14.8sec 02/18/20 5xSTS 45 sec    Time 8    Period Weeks    Status Achieved      PT  LONG TERM GOAL #5   Title Pt will ambulate with gait speed of 1.38m/s to demonstrate safe community  ambulation.    Baseline 03/26/20 0.35m/s 02/18/20 >1.20m/s not formally assessed, to be assessed next session    Time 8    Period Weeks    Status On-going                 Plan - 04/07/20 1000    Clinical Impression Statement PT continued therex progression for increased hip strength needed to complete functional transfers and for balance with good success. Patient is able to comply with all cuing for proper technique following cuing, and is motivated throughout session. patietn reports subjective progress with transfers. PT will continue progression as able.    Personal Factors and Comorbidities Age;Comorbidity 1    Comorbidities HTN    Examination-Activity Limitations Bathing;Bed Mobility;Bend;Caring for Others;Carry;Dressing;Lift;Locomotion Level;Sit;Squat;Stairs;Stand;Toileting;Transfers    Examination-Participation Restrictions Cleaning;Laundry;Community Activity    Stability/Clinical Decision Making Evolving/Moderate complexity    Clinical Decision Making Moderate    Rehab Potential Good    PT Frequency 2x / week    PT Duration 8 weeks    PT Treatment/Interventions ADLs/Self Care Home Management;Electrical Stimulation;Therapeutic activities;Patient/family education;Energy conservation;Therapeutic exercise;Spinal Manipulations;Joint Manipulations;Biofeedback;Cryotherapy;Moist Heat;Traction;Gait training;Stair training;Functional mobility training;Neuromuscular re-education;Balance training;Manual techniques;Passive range of motion;Taping;Dry needling;DME Instruction    PT Next Visit Plan Progress low level hip flexion activities (stair tap, marching), core/glute strengthening, gait over obstacles    PT Home Exercise Plan seated walk out stretch on table, Standing hip abd    Consulted and Agree with Plan of Care Patient           Patient will benefit from skilled therapeutic intervention in order to improve the following deficits and impairments:  Abnormal gait, Improper body  mechanics, Pain, Decreased coordination, Decreased mobility, Decreased activity tolerance, Decreased endurance, Decreased range of motion, Decreased strength, Hypomobility, Decreased balance, Difficulty walking, Impaired flexibility  Visit Diagnosis: Pain in right hip  Chronic right-sided low back pain, unspecified whether sciatica present  Sacroiliac dysfunction     Problem List Patient Active Problem List   Diagnosis Date Noted  . Essential hypertension, benign 08/24/2016  . Splenic artery aneurysm ([place]) 08/24/2016   Durwin Reges DPT Durwin Reges 04/07/2020, 10:22 AM  [place] [address], [place], [address] Phone: [phone]   Fax:  [phone]  Name: Traci Quinn MRN: [id number] Date of Birth: 1934/02/21

## 2020-04-10 ENCOUNTER — Encounter: Payer: Self-pay | Admitting: Physical Therapy

## 2020-04-10 ENCOUNTER — Other Ambulatory Visit: Payer: Self-pay

## 2020-04-10 ENCOUNTER — Ambulatory Visit: Payer: Medicare HMO | Admitting: Physical Therapy

## 2020-04-10 DIAGNOSIS — M25551 Pain in right hip: Secondary | ICD-10-CM

## 2020-04-10 DIAGNOSIS — G8929 Other chronic pain: Secondary | ICD-10-CM

## 2020-04-10 NOTE — Therapy (Signed)
[facility] [address], [place], [address] Phone: [phone]   Fax:  [phone]  Physical Therapy Treatment  Patient Details  Name: Traci Quinn MRN: [id number] Date of Birth: 09-15-1933 No data recorded  Encounter Date: 04/10/2020   PT End of Session - 04/10/20 1037    Visit Number 14    Number of Visits 16    Date for PT Re-Evaluation 04/14/20    PT Start Time 1033    PT Stop Time 1112    PT Time Calculation (min) 39 min    Activity Tolerance Patient tolerated treatment well;No increased pain    Behavior During Therapy WFL for tasks assessed/performed           Past Medical History:  Diagnosis Date  . Arthritis   . Asthma   . Cancer [facility])    left sided breast cancer  . GERD (gastroesophageal reflux disease)   . Hypertension     Past Surgical History:  Procedure Laterality Date  . ABDOMINAL HYSTERECTOMY    . APPENDECTOMY    . BREAST EXCISIONAL BIOPSY Right 1970   neg surgical exc  . BREAST LUMPECTOMY Left 2000   rad and no chemo  . BREAST SURGERY     lumpectomy  . CHOLECYSTECTOMY    . ESOPHAGOGASTRODUODENOSCOPY (EGD) WITH PROPOFOL N/A 08/06/2016   Procedure: ESOPHAGOGASTRODUODENOSCOPY (EGD) WITH PROPOFOL;  Surgeon: Lollie Sails, MD;  Location: [facility] ENDOSCOPY;  Service: Endoscopy;  Laterality: N/A;    There were no vitals filed for this visit.   Subjective Assessment - 04/10/20 1036    Subjective Paitent reports no pain today, and less pain getting in/out of car, reports this is 4/10 and comes/goes quickly. Compliance with HEP.    Pertinent History Pt is an 86 y.o female presenting to PT with c/o of R hip and low back pain 4 weeks s/p fall onto R posterolateral hip.  Pt reports she lost her balance while picking up something off her couch and rotating to place it on a table.  Pt reports this is the only fall she has had in the past 6 mo.  Pt reports aching pain with  occasional sharp pain in R hip and R low back (Worst: 8/10, Best: 0/10, Current 4/10) with pain radiating into anterior thigh to anterolateral knee.  Pain is worse in the mornings, gets better as the day goes on, and is aggravated by lifting the R leg, standing from a seated position, stairs, and walking.  Pt reports no previous hx of falls, trauma, or hx of back/hip injury or pain.  Pt reports she had sx of sciatica isolated to the R low back prior to her fall with no prior tx.  Prior to injury, pt amb using a SPC on outdoor, uneven surfaces only; now is using the [facility] any time she leaves her home.  Pt lives in a single story home with her 63 y.o. great grandson of whom she is the primary caregiver; pt reports her daughter also lives with her but she works and is only home in the late evening.  Pt has a ramp entry that she uses, and avoids using other entrances in her home with stairs d/t difficulty with ascending/descending stairs.  Pt also reports difficulty with completing household chores d/t hip pain.  Pt goals with therapy are to be able to lift RLE and reduce pain.  Pt reported PMH of breast cancer that is in remission, HTN,  and asthma.  Pt denies N/V, B&B changes, unexplained weight fluctuation, saddle paresthesia, fever, night sweats, or unrelenting night pain at this time.    Limitations Sitting;Lifting;Standing;Walking;House hold activities    How long can you sit comfortably? 15 min    How long can you stand comfortably? 10 min    How long can you walk comfortably? 1-2 min    Patient Stated Goals To be able to lift RLE and reduce hip/back pain    Pain Onset 1 to 4 weeks ago               TherEx Nustep L3seat 7 UE 8 45mins with min cuing to maintain SPM >50 Seated clamshell/hip ER RTB x10 good carry over of cuing for eccentric control Seated hip flex + ER/abd RTB 3x 10 with cuing for posture and core engagement with decent carry over SLR with 3# AW 3x 10 with min cuing to maintain knee  ext with decent carry over Alt reverse lunge 3x 10 with cuing to "push through" R heel for increased RLE effort with good carry over Forward <> backward step over 6in hurdle 3x 10  OMEGA leg press 15# x10; 20# 2x 10with min cuing initially to prevent hip IR/knee valgus with good carry over                         PT Education - 04/10/20 1037    Education Details therex form/technique    Person(s) Educated Patient    Methods Explanation;Demonstration;Verbal cues    Comprehension Verbal cues required;Returned demonstration;Verbalized understanding            PT Short Term Goals - 03/27/20 1043      PT SHORT TERM GOAL #1   Title Pt will be independent with HEP in order to improve strength and balance in order to decrease fall risk and improve function at home and work.    Baseline 03/27/20 Completing regularly 02/18/20 pt educated on HEP and given handout    Time 2    Period Weeks    Status Achieved             PT Long Term Goals - 03/27/20 1043      PT LONG TERM GOAL #1   Title Pt will increase FOTO score to 59 to indicate increased functional mobility for ADLs.    Baseline 03/27/20 49 02/18/20 FOTO 41    Time 8    Period Weeks    Status On-going      PT LONG TERM GOAL #2   Title Pt will decrease worst hip pain as reported on NPRS by at least 2 points in order to demonstrate clinically significant reduction in hip pain.    Baseline 03/27/20 5/10 02/18/20 Worst 8/10    Time 8    Period Weeks    Status Achieved      PT LONG TERM GOAL #3   Title Pt will decrease TUG to below 14 seconds in order to demonstrate decreased fall risk.    Baseline 03/26/20 14sec 02/18/20 TUG 24.7 sec    Time 8    Period Weeks    Status On-going      PT LONG TERM GOAL #4   Title Pt will decrease 5xSTS to 15 seconds or less to match pt population age norms indicating increased LE strength and decreased fall risk.    Baseline 03/27/20 14.8sec 02/18/20 5xSTS 45 sec    Time 8  Period  Weeks    Status Achieved      PT LONG TERM GOAL #5   Title Pt will ambulate with gait speed of 1.42m/s to demonstrate safe community ambulation.    Baseline 03/26/20 0.70m/s 02/18/20 >1.35m/s not formally assessed, to be assessed next session    Time 8    Period Weeks    Status On-going                 Plan - 04/10/20 1046    Clinical Impression Statement PT continued therex progression for increased hip strengthening with carry over into functional transfers with success. Patient is able to comply with cuing for proper technique of therex with good motivation throughout session, without increased pain. PT plans to d/c patient next visit in absence of excessive pain or change to function.    Personal Factors and Comorbidities Age;Comorbidity 1    Examination-Activity Limitations Bathing;Bed Mobility;Bend;Caring for Others;Carry;Dressing;Lift;Locomotion Level;Sit;Squat;Stairs;Stand;Toileting;Transfers    Examination-Participation Restrictions Cleaning;Laundry;Community Activity    Stability/Clinical Decision Making Evolving/Moderate complexity    Clinical Decision Making Moderate    Rehab Potential Good    PT Frequency 2x / week    PT Duration 8 weeks    PT Treatment/Interventions ADLs/Self Care Home Management;Electrical Stimulation;Therapeutic activities;Patient/family education;Energy conservation;Therapeutic exercise;Spinal Manipulations;Joint Manipulations;Biofeedback;Cryotherapy;Moist Heat;Traction;Gait training;Stair training;Functional mobility training;Neuromuscular re-education;Balance training;Manual techniques;Passive range of motion;Taping;Dry needling;DME Instruction    PT Next Visit Plan Progress low level hip flexion activities (stair tap, marching), core/glute strengthening, gait over obstacles    PT Home Exercise Plan seated walk out stretch on table, Standing hip abd    Consulted and Agree with Plan of Care Patient           Patient will benefit from skilled  therapeutic intervention in order to improve the following deficits and impairments:  Abnormal gait, Improper body mechanics, Pain, Decreased coordination, Decreased mobility, Decreased activity tolerance, Decreased endurance, Decreased range of motion, Decreased strength, Hypomobility, Decreased balance, Difficulty walking, Impaired flexibility  Visit Diagnosis: Pain in right hip  Chronic right-sided low back pain, unspecified whether sciatica present     Problem List Patient Active Problem List   Diagnosis Date Noted  . Essential hypertension, benign 08/24/2016  . Splenic artery aneurysm ([place]) 08/24/2016   Durwin Reges DPT Durwin Reges 04/10/2020, 11:11 AM  [place] [address] [place], [address] Phone: [phone]   Fax:  [phone]  Name: Traci Quinn MRN: [id number] Date of Birth: August 03, 1934

## 2020-04-14 ENCOUNTER — Encounter: Payer: Self-pay | Admitting: Physical Therapy

## 2020-04-14 ENCOUNTER — Other Ambulatory Visit: Payer: Self-pay

## 2020-04-14 ENCOUNTER — Ambulatory Visit: Payer: Medicare HMO | Admitting: Physical Therapy

## 2020-04-14 DIAGNOSIS — G8929 Other chronic pain: Secondary | ICD-10-CM

## 2020-04-14 DIAGNOSIS — M25551 Pain in right hip: Secondary | ICD-10-CM

## 2020-04-14 DIAGNOSIS — M533 Sacrococcygeal disorders, not elsewhere classified: Secondary | ICD-10-CM

## 2020-04-14 NOTE — Therapy (Signed)
[facility] [address] [place], [address] Phone: [phone]   Fax:  [phone]  Physical Therapy Treatment/Discharge Summary Reporting Period: 02/18/20 - 04/14/20  Patient Details  Name: Traci Quinn MRN: [id number] Date of Birth: 01-07-1934 No data recorded  Encounter Date: 04/14/2020   PT End of Session - 04/14/20 1031    Visit Number 15    Number of Visits 16    Date for PT Re-Evaluation 04/14/20    PT Start Time 1026    PT Stop Time 1104    PT Time Calculation (min) 38 min    Activity Tolerance Patient tolerated treatment well;No increased pain    Behavior During Therapy WFL for tasks assessed/performed           Past Medical History:  Diagnosis Date  . Arthritis   . Asthma   . Cancer [facility])    left sided breast cancer  . GERD (gastroesophageal reflux disease)   . Hypertension     Past Surgical History:  Procedure Laterality Date  . ABDOMINAL HYSTERECTOMY    . APPENDECTOMY    . BREAST EXCISIONAL BIOPSY Right 1970   neg surgical exc  . BREAST LUMPECTOMY Left 2000   rad and no chemo  . BREAST SURGERY     lumpectomy  . CHOLECYSTECTOMY    . ESOPHAGOGASTRODUODENOSCOPY (EGD) WITH PROPOFOL N/A 08/06/2016   Procedure: ESOPHAGOGASTRODUODENOSCOPY (EGD) WITH PROPOFOL;  Surgeon: Lollie Sails, MD;  Location: [facility] ENDOSCOPY;  Service: Endoscopy;  Laterality: N/A;    There were no vitals filed for this visit.   Subjective Assessment - 04/14/20 1030    Subjective Patient reports no pain today, and that getting in/out of the car is better. She is ready to d/c to HEP.    Pertinent History Pt is an 86 y.o female presenting to PT with c/o of R hip and low back pain 4 weeks s/p fall onto R posterolateral hip.  Pt reports she lost her balance while picking up something off her couch and rotating to place it on a table.  Pt reports this is the only fall she has had in the past 6 mo.  Pt reports  aching pain with occasional sharp pain in R hip and R low back (Worst: 8/10, Best: 0/10, Current 4/10) with pain radiating into anterior thigh to anterolateral knee.  Pain is worse in the mornings, gets better as the day goes on, and is aggravated by lifting the R leg, standing from a seated position, stairs, and walking.  Pt reports no previous hx of falls, trauma, or hx of back/hip injury or pain.  Pt reports she had sx of sciatica isolated to the R low back prior to her fall with no prior tx.  Prior to injury, pt amb using a SPC on outdoor, uneven surfaces only; now is using the [facility] any time she leaves her home.  Pt lives in a single story home with her 56 y.o. great grandson of whom she is the primary caregiver; pt reports her daughter also lives with her but she works and is only home in the late evening.  Pt has a ramp entry that she uses, and avoids using other entrances in her home with stairs d/t difficulty with ascending/descending stairs.  Pt also reports difficulty with completing household chores d/t hip pain.  Pt goals with therapy are to be able to lift RLE and reduce pain.  Pt reported PMH of breast cancer  that is in remission, HTN, and asthma.  Pt denies N/V, B&B changes, unexplained weight fluctuation, saddle paresthesia, fever, night sweats, or unrelenting night pain at this time.    Limitations Sitting;Lifting;Standing;Walking;House hold activities    How long can you sit comfortably? 15 min    How long can you stand comfortably? 10 min    How long can you walk comfortably? 1-2 min    Patient Stated Goals To be able to lift RLE and reduce hip/back pain    Pain Onset 1 to 4 weeks ago           TherEx Nustep L3seat 7 UE 8 66mns with min cuing to maintain SPM >50 TUG supervision x2 trials PT reviewed the following HEP with patient with patient able to demonstrate a set of the following with min cuing for correction needed. PT educated patient on parameters of therex (how/when to  inc/decrease intensity, frequency, rep/set range, stretch hold time, and purpose of therex) with verbalized understanding.   Access Code: [email or web address] Child's Pose with Table - 3 x daily - 7 x weekly - 30sec hold Standing Single Leg Stance with Counter Support - 3 x daily - 7 x weekly - 30sec hold Sit to Stand - 1 x daily - 2-3 x weekly - 3 sets - 10 reps Standing Hip Abduction with Counter Support - 1 x daily - 2-3 x weekly - 3 sets - 10 reps Seated March - 1 x daily - 2-3 x weekly - 3 sets - 10 reps Supine Bridge - 1 x daily - 2-3 x weekly - 3 sets - 10 reps     PT Education - 04/14/20 1031    Education Details d/c recommendations    Person(s) Educated Patient    Methods Explanation;Demonstration;Verbal cues    Comprehension Verbalized understanding;Returned demonstration;Verbal cues required            PT Short Term Goals - 03/27/20 1043      PT SHORT TERM GOAL #1   Title Pt will be independent with HEP in order to improve strength and balance in order to decrease fall risk and improve function at home and work.    Baseline 03/27/20 Completing regularly 02/18/20 pt educated on HEP and given handout    Time 2    Period Weeks    Status Achieved             PT Long Term Goals - 04/14/20 1032      PT LONG TERM GOAL #1   Title Pt will increase FOTO score to 59 to indicate increased functional mobility for ADLs.    Baseline 04/14/20 67 03/27/20 49 02/18/20 FOTO 41    Time 8    Period Weeks      PT LONG TERM GOAL #2   Title Pt will decrease worst hip pain as reported on NPRS by at least 2 points in order to demonstrate clinically significant reduction in hip pain.    Baseline 03/27/20 5/10 02/18/20 Worst 8/10    Time 8    Period Weeks    Status Achieved      PT LONG TERM GOAL #3   Title Pt will decrease TUG to below 14 seconds in order to demonstrate decreased fall risk.    Baseline 04/14/20 13.2sec 03/26/20 14sec 02/18/20 TUG 24.7 sec    Time 8    Period Weeks    Status  Achieved      PT LONG TERM GOAL #4   Title  Pt will decrease 5xSTS to 15 seconds or less to match pt population age norms indicating increased LE strength and decreased fall risk.    Baseline 03/27/20 14.8sec 02/18/20 5xSTS 45 sec    Time 8    Period Weeks    Status Achieved      PT LONG TERM GOAL #5   Title Pt will ambulate with gait speed of 1.420ms to demonstrate safe community ambulation.    Baseline 04/14/20 1.058m 03/26/20 0.8565m6/28/21 >1.20m/13mot formally assessed, to be assessed next session    Time 8    Period Weeks    Status Achieved                 Plan - 04/14/20 1506    Clinical Impression Statement PT re-assessed goals this session where patient has met all goals to d/c to HEP. Patient is able to demonstrate and verbalize understanding of all HEP recommendations. Pt given clinic contact info should any further questions/concerns arise.    Personal Factors and Comorbidities Age;Comorbidity 1    Comorbidities HTN    Examination-Activity Limitations Bathing;Bed Mobility;Bend;Caring for Others;Carry;Dressing;Lift;Locomotion Level;Sit;Squat;Stairs;Stand;Toileting;Transfers    Examination-Participation Restrictions Cleaning;Laundry;Community Activity    Stability/Clinical Decision Making Evolving/Moderate complexity    Clinical Decision Making Moderate    Rehab Potential Good    PT Frequency 2x / week    PT Duration 8 weeks    PT Treatment/Interventions ADLs/Self Care Home Management;Electrical Stimulation;Therapeutic activities;Patient/family education;Energy conservation;Therapeutic exercise;Spinal Manipulations;Joint Manipulations;Biofeedback;Cryotherapy;Moist Heat;Traction;Gait training;Stair training;Functional mobility training;Neuromuscular re-education;Balance training;Manual techniques;Passive range of motion;Taping;Dry needling;DME Instruction    PT Next Visit Plan Progress low level hip flexion activities (stair tap, marching), core/glute strengthening, gait over  obstacles    PT Home Exercise Plan seated walk out stretch on table, Standing hip abd    Consulted and Agree with Plan of Care Patient           Patient will benefit from skilled therapeutic intervention in order to improve the following deficits and impairments:  Abnormal gait, Improper body mechanics, Pain, Decreased coordination, Decreased mobility, Decreased activity tolerance, Decreased endurance, Decreased range of motion, Decreased strength, Hypomobility, Decreased balance, Difficulty walking, Impaired flexibility  Visit Diagnosis: Pain in right hip  Chronic right-sided low back pain, unspecified whether sciatica present  Sacroiliac dysfunction     Problem List Patient Active Problem List   Diagnosis Date Noted  . Essential hypertension, benign 08/24/2016  . Splenic artery aneurysm (HCC)[place]/09/2016   ChelDurwin Reges ChelDurwin Reges3/2021, 3:09 PM  [facility] [address] [id number]: [phone]:  [phone]: DoroYAMILETH HAYSE: [id number] of Birth: 08/2506-Mar-1935

## 2020-04-17 ENCOUNTER — Encounter: Payer: PRIVATE HEALTH INSURANCE | Admitting: Physical Therapy

## 2020-04-21 ENCOUNTER — Encounter: Payer: PRIVATE HEALTH INSURANCE | Admitting: Physical Therapy

## 2020-08-04 IMAGING — MG DIGITAL DIAGNOSTIC BILATERAL MAMMOGRAM WITH TOMO AND CAD
6 of 10 series · 6 of 30 positions shown · non-contrast
Comparison: Previous exam(s).

CLINICAL DATA: Patient presents focal left breast tenderness.

EXAM:
DIGITAL DIAGNOSTIC BILATERAL MAMMOGRAM WITH CAD AND TOMO
ULTRASOUND LEFT BREAST

[L MLO synth-2D]
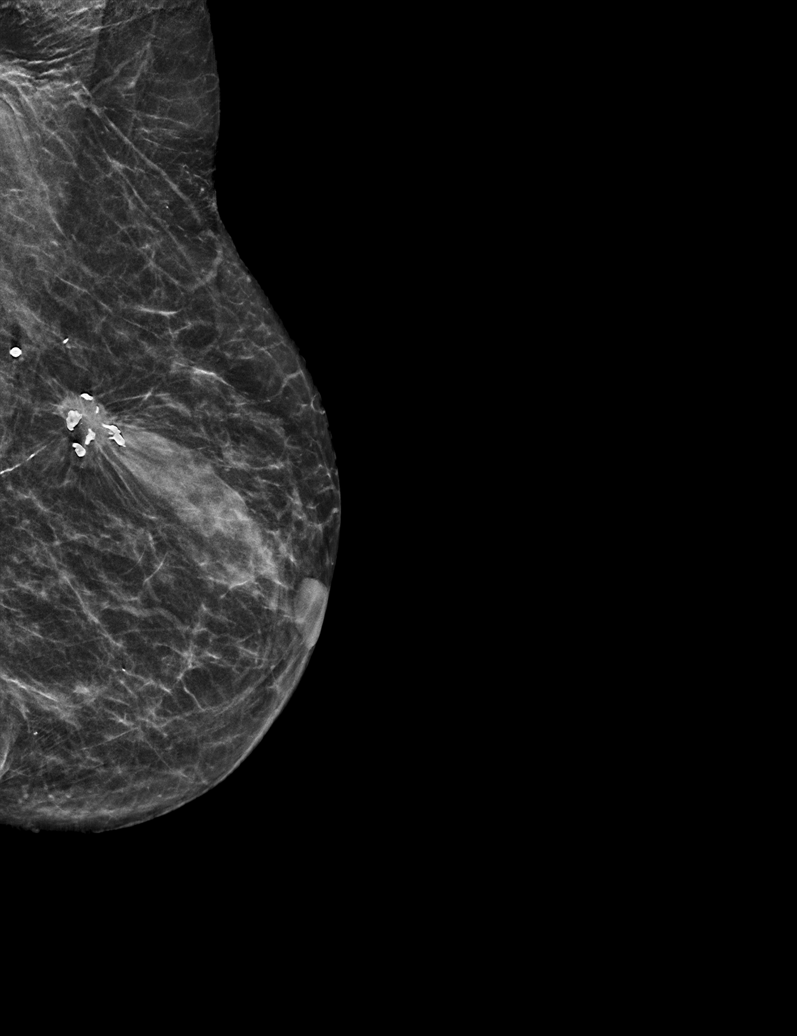

[R CC synth-2D]
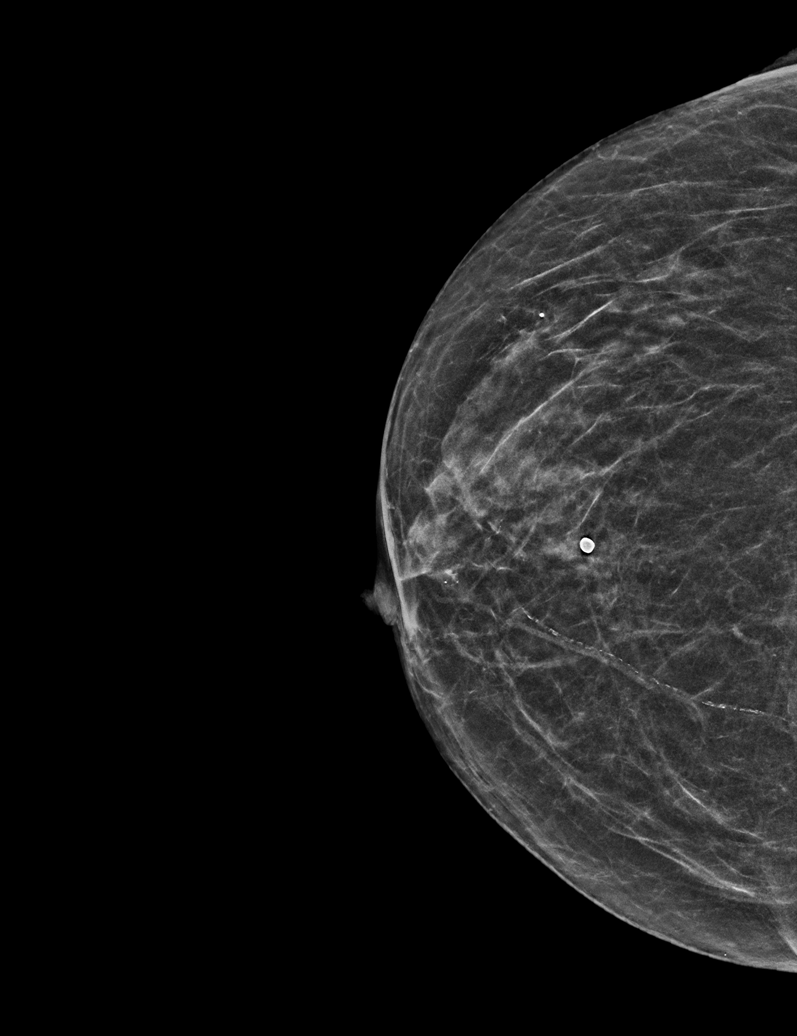

[L ML synth-2D]
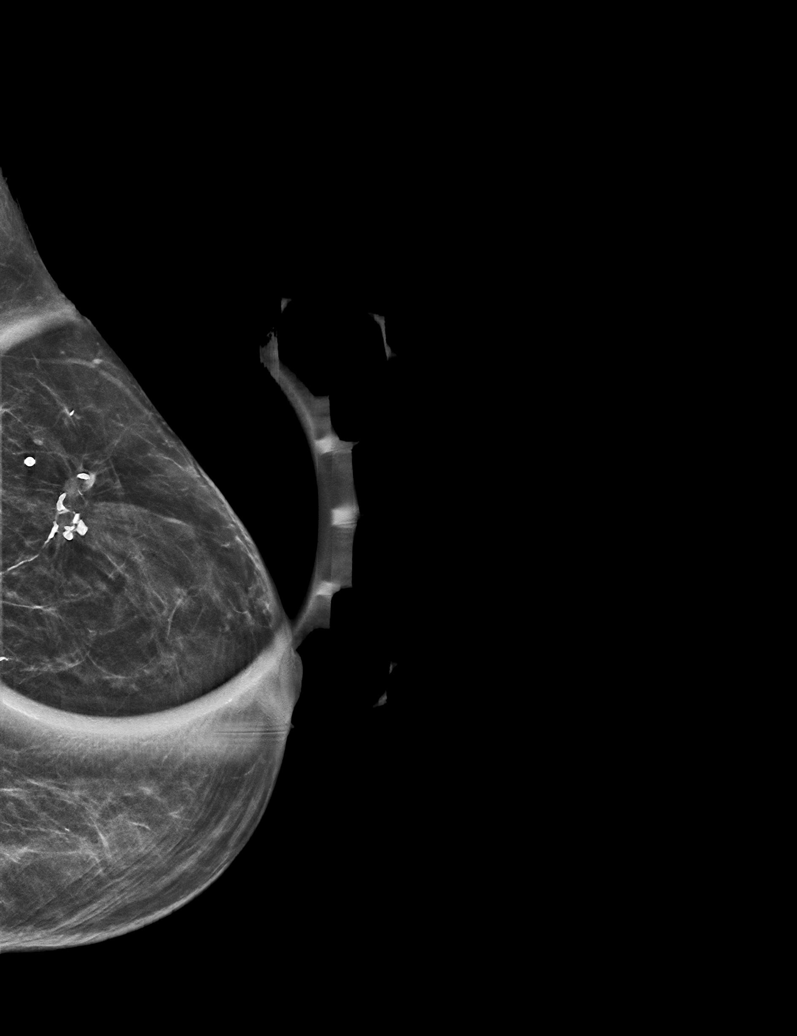

[L CC synth-2D]
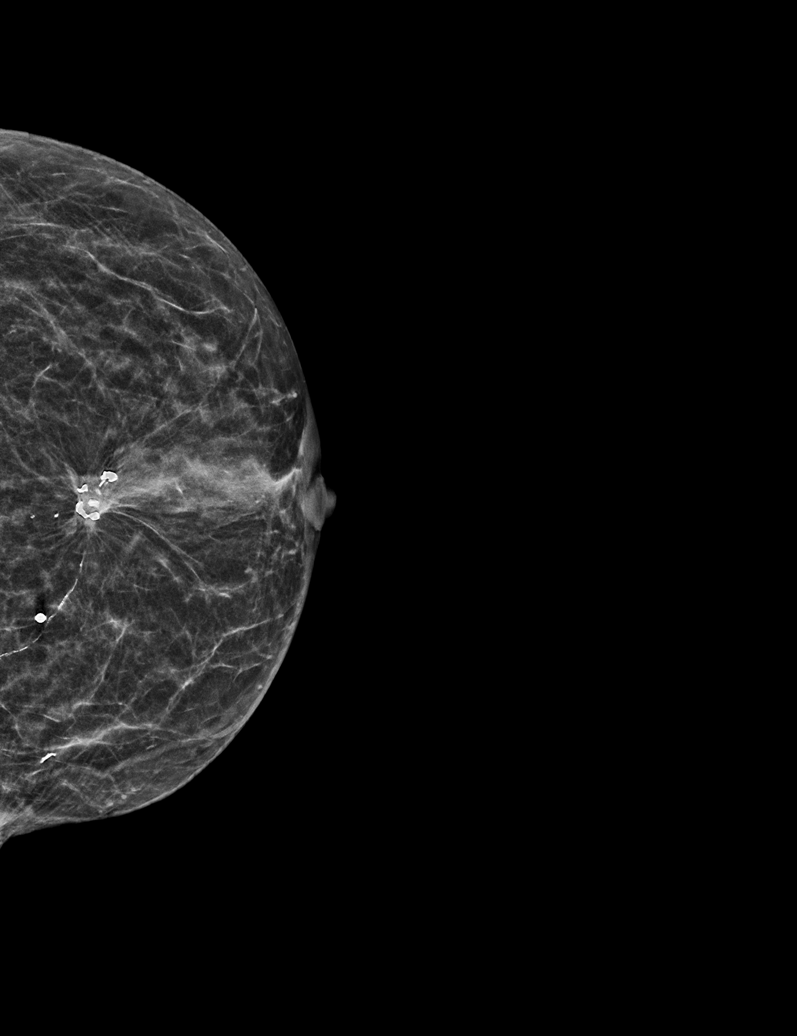

[R MLO synth-2D]
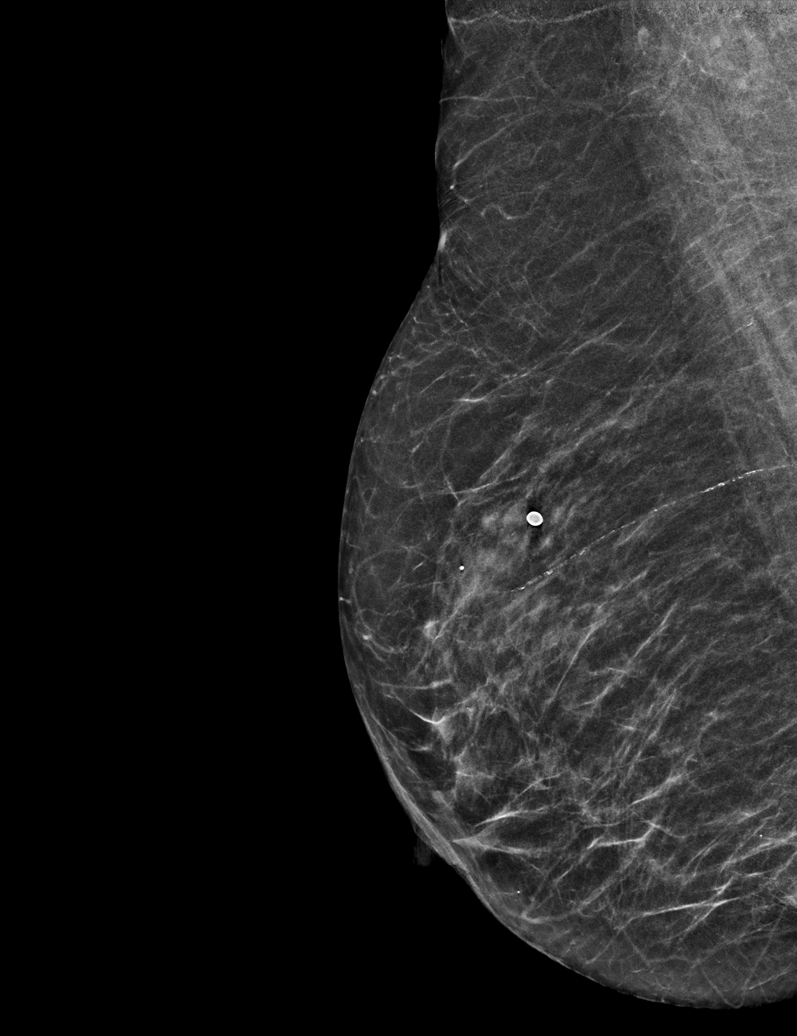

[L CC tomo · tomo slice 19/38.0]
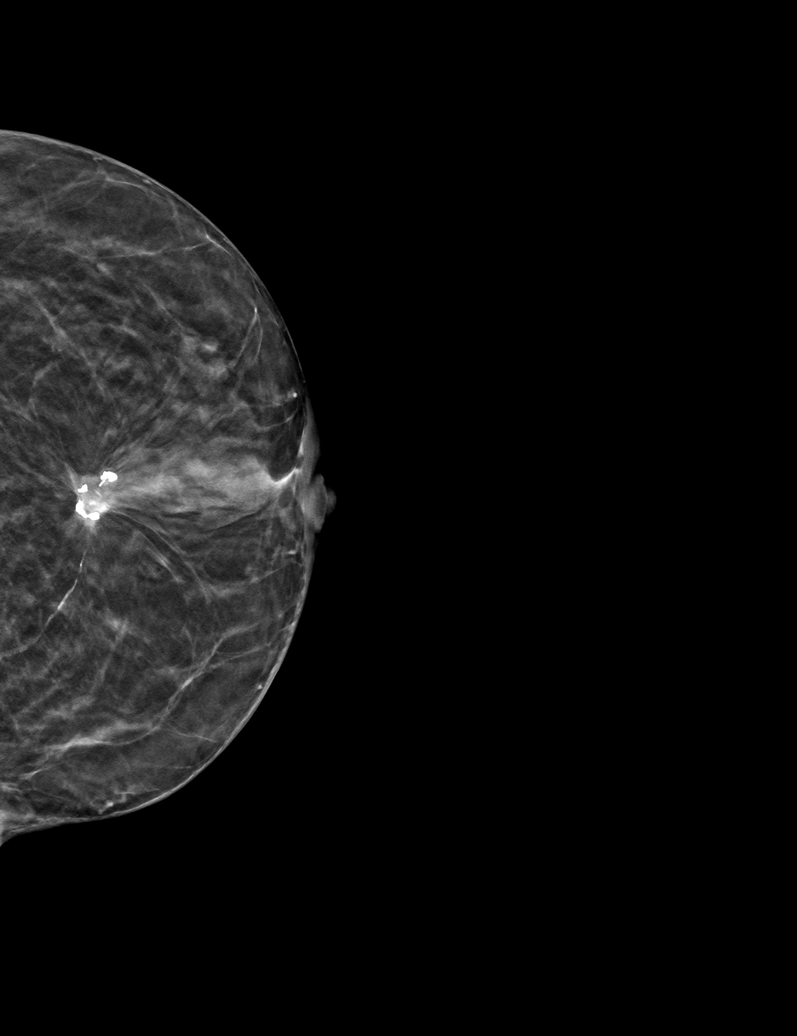

[6 of 30 positions shown; findings below may reference images not displayed]

ACR Breast Density Category b: There are scattered areas of
fibroglandular density.
FINDINGS: Stable postlumpectomy changes left breast. No new masses,
calcifications or nonsurgical distortion identified within either
breast.

Mammographic images were processed with CAD.

On physical exam, no discrete mass is palpated within the upper
inner left breast.

Targeted ultrasound is performed, showing postlumpectomy changes
within the left breast 11 o'clock position at the site of focal
tenderness. No suspicious masses identified.
IMPRESSION: Postlumpectomy changes left breast at the site of focal tenderness.

No mammographic evidence for malignancy.

RECOMMENDATION:
Screening mammogram in one year.(Code:DA-D-LOE).

Continued clinical evaluation for left breast focal tenderness.

I have discussed the findings and recommendations with the patient.
Results were also provided in writing at the conclusion of the
visit. If applicable, a reminder letter will be sent to the patient
regarding the next appointment.

BI-RADS CATEGORY  2: Benign.

## 2021-06-15 ENCOUNTER — Other Ambulatory Visit (INDEPENDENT_AMBULATORY_CARE_PROVIDER_SITE_OTHER): Payer: Self-pay | Admitting: Vascular Surgery

## 2021-06-15 DIAGNOSIS — I728 Aneurysm of other specified arteries: Secondary | ICD-10-CM

## 2021-06-16 ENCOUNTER — Other Ambulatory Visit: Payer: Self-pay

## 2021-06-16 ENCOUNTER — Encounter (INDEPENDENT_AMBULATORY_CARE_PROVIDER_SITE_OTHER): Payer: Self-pay | Admitting: Vascular Surgery

## 2021-06-16 ENCOUNTER — Ambulatory Visit (INDEPENDENT_AMBULATORY_CARE_PROVIDER_SITE_OTHER): Payer: Medicare HMO

## 2021-06-16 ENCOUNTER — Ambulatory Visit (INDEPENDENT_AMBULATORY_CARE_PROVIDER_SITE_OTHER): Payer: Medicare HMO | Admitting: Vascular Surgery

## 2021-06-16 VITALS — BP 157/74 | HR 64 | Resp 16 | Ht <= 58 in | Wt 144.0 lb

## 2021-06-16 DIAGNOSIS — I10 Essential (primary) hypertension: Secondary | ICD-10-CM

## 2021-06-16 DIAGNOSIS — I728 Aneurysm of other specified arteries: Secondary | ICD-10-CM | POA: Diagnosis not present

## 2021-06-16 DIAGNOSIS — N1832 Chronic kidney disease, stage 3b: Secondary | ICD-10-CM | POA: Diagnosis not present

## 2021-06-16 DIAGNOSIS — I129 Hypertensive chronic kidney disease with stage 1 through stage 4 chronic kidney disease, or unspecified chronic kidney disease: Secondary | ICD-10-CM

## 2021-06-16 NOTE — Progress Notes (Signed)
Patient ID: Traci Quinn, female   DOB: 03/02/1934, 87 y.o.   MRN: [id number]  Chief Complaint  Patient presents with   New Patient (Initial Visit)    Ref Ouida Sills mesenteric consult,splenic artery aneurysm     HPI Traci Quinn is a 87 y.o. female.  I am asked to see the patient by Dr. Ouida Sills for evaluation of her splenic artery aneurysm.  This has not been seen in about 4 years since we last studied her.  She is doing well and has no specific complaints today.  She does not have any abdominal pain or left flank pain.  Duplex today is performed which demonstrates a stable 0.9 cm distal splenic artery aneurysm without significant change from her previous study in 2018.     Past Medical History:  Diagnosis Date   Arthritis    Asthma    Cancer ([place])    left sided breast cancer   GERD (gastroesophageal reflux disease)    Hypertension     Past Surgical History:  Procedure Laterality Date   ABDOMINAL HYSTERECTOMY     APPENDECTOMY     BREAST EXCISIONAL BIOPSY Right 1970   neg surgical exc   BREAST LUMPECTOMY Left 2000   rad and no chemo   BREAST SURGERY     lumpectomy   CHOLECYSTECTOMY     ESOPHAGOGASTRODUODENOSCOPY (EGD) WITH PROPOFOL N/A 08/06/2016   Procedure: ESOPHAGOGASTRODUODENOSCOPY (EGD) WITH PROPOFOL;  Surgeon: Lollie Sails, MD;  Location: [facility] ENDOSCOPY;  Service: Endoscopy;  Laterality: N/A;     Family History  Problem Relation Age of Onset   Cancer Mother    Cancer Father    COPD Daughter       Social History   Tobacco Use   Smoking status: Never   Smokeless tobacco: Never  Substance Use Topics   Alcohol use: No     Allergies  Allergen Reactions   Atorvastatin     Other reaction(s): Muscle Pain   Celecoxib     Other reaction(s): Other (See Comments) GI bleed   Indomethacin Nausea And Vomiting    Other reaction(s): Headache   Zetia [Ezetimibe]     Current Outpatient Medications  Medication Sig Dispense Refill    amitriptyline (ELAVIL) 25 MG tablet      aspirin 81 MG tablet Take 81 mg by mouth daily.     budesonide-formoterol (SYMBICORT) 160-4.5 MCG/ACT inhaler Inhale 2 puffs into the lungs as needed.     Cetirizine HCl (ZYRTEC PO) Take by mouth daily.     cyanocobalamin 1000 MCG tablet Take by mouth.     fluticasone (VERAMYST) 27.5 MCG/SPRAY nasal spray Place 2 sprays into the nose as needed for rhinitis.     hydrochlorothiazide (HYDRODIURIL) 25 MG tablet Take 25 mg by mouth daily.     lovastatin (MEVACOR) 20 MG tablet      propranolol ER (INDERAL LA) 120 MG 24 hr capsule      losartan-hydrochlorothiazide (HYZAAR) 100-12.5 MG per tablet  (Patient not taking: Reported on 06/16/2021)     omeprazole (PRILOSEC) 40 MG capsule Take by mouth. (Patient not taking: Reported on 06/16/2021)     pantoprazole (PROTONIX) 40 MG tablet Take 40 mg by mouth daily. (Patient not taking: Reported on 06/16/2021)     traMADol (ULTRAM) 50 MG tablet  (Patient not taking: Reported on 06/16/2021)     No current facility-administered medications for this visit.      REVIEW OF SYSTEMS (Negative unless checked)  Constitutional: [] Weight  loss  [] Fever  [] Chills Cardiac: [] Chest pain   [] Chest pressure   [] Palpitations   [] Shortness of breath when laying flat   [] Shortness of breath at rest   [] Shortness of breath with exertion. Vascular:  [] Pain in legs with walking   [] Pain in legs at rest   [] Pain in legs when laying flat   [] Claudication   [] Pain in feet when walking  [] Pain in feet at rest  [] Pain in feet when laying flat   [] History of DVT   [] Phlebitis   [] Swelling in legs   [] Varicose veins   [] Non-healing ulcers Pulmonary:   [] Uses home oxygen   [] Productive cough   [] Hemoptysis   [] Wheeze  [] COPD   [] Asthma Neurologic:  [] Dizziness  [] Blackouts   [] Seizures   [] History of stroke   [] History of TIA  [] Aphasia   [] Temporary blindness   [] Dysphagia   [] Weakness or numbness in arms   [] Weakness or numbness in  legs Musculoskeletal:  [] Arthritis   [] Joint swelling   [] Joint pain   [] Low back pain Hematologic:  [] Easy bruising  [] Easy bleeding   [] Hypercoagulable state   [] Anemic  [] Hepatitis Gastrointestinal:  [] Blood in stool   [] Vomiting blood  [] Gastroesophageal reflux/heartburn   [] Abdominal pain Genitourinary:  [] Chronic kidney disease   [] Difficult urination  [] Frequent urination  [] Burning with urination   [] Hematuria Skin:  [] Rashes   [] Ulcers   [] Wounds Psychological:  [] History of anxiety   []  History of major depression.    Physical Exam BP (!) 157/74 (BP Location: Right Arm)   Pulse 64   Resp 16   Ht 4\' 10"  (1.473 m)   Wt 144 lb (65.3 kg)   BMI 30.10 kg/m  Gen:  WD/WN, NAD. Appears younger than stated. Head: [place]/AT, No temporalis wasting.  Ear/Nose/Throat: Hearing grossly intact, nares w/o erythema or drainage, oropharynx w/o Erythema/Exudate Eyes: Conjunctiva clear, sclera non-icteric  Neck: trachea midline.  No JVD.  Pulmonary:  Good air movement, respirations not labored, no use of accessory muscles  Cardiac: RRR, no JVD Vascular:  Vessel Right Left  Radial Palpable Palpable                                   Gastrointestinal:. No masses, surgical incisions, or scars. No tenderness Musculoskeletal: M/S 5/5 throughout.  Extremities without ischemic changes.  No deformity or atrophy. No edema. Neurologic: Sensation grossly intact in extremities.  Symmetrical.  Speech is fluent. Motor exam as listed above. Psychiatric: Judgment intact, Mood & affect appropriate for pt's clinical situation. Dermatologic: No rashes or ulcers noted.  No cellulitis or open wounds.    Radiology No results found.  Labs No results found for this or any previous visit (from the past 2160 hour(s)).  Assessment/Plan: Essential hypertension, benign blood pressure control important in reducing the progression of atherosclerotic disease and aneurysmal degeneration. On appropriate oral  medications.  Stage IIIb chronic kidney disease Avoid contrast and follow this with duplex every other year.     Splenic artery aneurysm (HCC) Duplex today demonstrates a stable 0.9 cm splenic artery aneurysm without evidence of rupture. This really has not changed over the past 4-5 years. We will continue to follow this on an every other year basis with duplex unless problems develop in the interim.     Leotis Pain 06/16/2021, 8:49 AM   This note was created with Dragon medical transcription system.  Any errors from dictation are  unintentional.

## 2021-06-25 ENCOUNTER — Other Ambulatory Visit: Payer: Self-pay

## 2021-06-25 ENCOUNTER — Ambulatory Visit: Payer: Medicare HMO | Admitting: Dermatology

## 2021-06-25 ENCOUNTER — Encounter: Payer: Self-pay | Admitting: Dermatology

## 2021-06-25 DIAGNOSIS — L570 Actinic keratosis: Secondary | ICD-10-CM | POA: Diagnosis not present

## 2021-06-25 DIAGNOSIS — Z1283 Encounter for screening for malignant neoplasm of skin: Secondary | ICD-10-CM | POA: Diagnosis not present

## 2021-06-25 DIAGNOSIS — L578 Other skin changes due to chronic exposure to nonionizing radiation: Secondary | ICD-10-CM

## 2021-06-25 DIAGNOSIS — S51801A Unspecified open wound of right forearm, initial encounter: Secondary | ICD-10-CM

## 2021-06-25 DIAGNOSIS — L720 Epidermal cyst: Secondary | ICD-10-CM | POA: Diagnosis not present

## 2021-06-25 DIAGNOSIS — L814 Other melanin hyperpigmentation: Secondary | ICD-10-CM

## 2021-06-25 DIAGNOSIS — L821 Other seborrheic keratosis: Secondary | ICD-10-CM

## 2021-06-25 DIAGNOSIS — D229 Melanocytic nevi, unspecified: Secondary | ICD-10-CM

## 2021-06-25 DIAGNOSIS — D1800 Hemangioma unspecified site: Secondary | ICD-10-CM

## 2021-06-25 DIAGNOSIS — T148XXA Other injury of unspecified body region, initial encounter: Secondary | ICD-10-CM

## 2021-06-25 MED ORDER — MUPIROCIN 2 % EX OINT: 1.0000 "application " | TOPICAL_OINTMENT | Freq: Every day | CUTANEOUS | 0 refills | Status: DC

## 2021-06-25 NOTE — Progress Notes (Signed)
New Patient Visit  Subjective  Traci Quinn is a 87 y.o. female who presents for the following: FBSE (Patient here for full body skin exam and skin cancer screening. Patient with no hx of skin cancer. She does have a hard spot at face that has been there for about 1 year. New patient would like to establish care. ).  The following portions of the chart were reviewed this encounter and updated as appropriate:   Tobacco  Allergies  Meds  Problems  Med Hx  Surg Hx  Fam Hx      Review of Systems:  No other skin or systemic complaints except as noted in HPI or Assessment and Plan.  Objective  Well appearing patient in no apparent distress; mood and affect are within normal limits.  A full examination was performed including scalp, head, eyes, ears, nose, lips, neck, chest, axillae, abdomen, back, buttocks, bilateral upper extremities, bilateral lower extremities, hands, feet, fingers, toes, fingernails, and toenails. All findings within normal limits unless otherwise noted below.  Right Forearm Open wound  right lateral calf x 1, mid forehead x 1 (2) Erythematous thin papules/macules with gritty scale.   Right Lower Cutaneous Lip Firm white subcutaneous papule   Assessment & Plan  Open wound Right Forearm  Clean with soap and water daily, apply mupirocin and cover  mupirocin ointment (BACTROBAN) 2 % - Right Forearm Apply 1 application topically daily.  AK (actinic keratosis) (2) right lateral calf x 1, mid forehead x 1  Prior to procedure, discussed risks of blister formation, small wound, skin dyspigmentation, or rare scar following cryotherapy. Recommend Vaseline ointment to treated areas while healing.  Prior to procedure, discussed risks of blister formation, small wound, skin dyspigmentation, or rare scar following cryotherapy. Recommend Vaseline ointment to treated areas while healing.   Destruction of lesion - right lateral calf x 1, mid forehead x  1  Destruction method: cryotherapy   Informed consent: discussed and consent obtained   Lesion destroyed using liquid nitrogen: Yes   Cryotherapy cycles:  2 Outcome: patient tolerated procedure well with no complications   Post-procedure details: wound care instructions given    Epidermal cyst Right Lower Cutaneous Lip  Benign-appearing. Exam most consistent with an epidermal inclusion cyst. Discussed that a cyst is a benign growth that can grow over time and sometimes get irritated or inflamed. Recommend observation if it is not bothersome. Discussed option of surgical excision to remove it if it is growing, symptomatic, or other changes noted. Please call for new or changing lesions so they can be evaluated.  Lentigines - Scattered tan macules - Due to sun exposure - Benign-appearing, observe - Recommend daily broad spectrum sunscreen SPF 30+ to sun-exposed areas, reapply every 2 hours as needed. - Call for any changes  Seborrheic Keratoses - Stuck-on, waxy, tan-brown papules and/or plaques  - Benign-appearing - Discussed benign etiology and prognosis. - Observe - Call for any changes  Melanocytic Nevi - Tan-brown and/or pink-flesh-colored symmetric macules and papules - Benign appearing on exam today - Observation - Call clinic for new or changing moles - Recommend daily use of broad spectrum spf 30+ sunscreen to sun-exposed areas.   Hemangiomas - Red papules - Discussed benign nature - Observe - Call for any changes  Actinic Damage - Chronic condition, secondary to cumulative UV/sun exposure - diffuse scaly erythematous macules with underlying dyspigmentation - Recommend daily broad spectrum sunscreen SPF 30+ to sun-exposed areas, reapply every 2 hours as needed.  -  Staying in the shade or wearing long sleeves, sun glasses (UVA+UVB protection) and wide brim hats (4-inch brim around the entire circumference of the hat) are also recommended for sun protection.  - Call  for new or changing lesions.  Skin cancer screening performed today.  Return in about 1 year (around 06/25/2022) for TBSE.  Graciella Belton, RMA, am acting as scribe for Forest Gleason, MD .  Documentation: I have reviewed the above documentation for accuracy and completeness, and I agree with the above.  Forest Gleason, MD

## 2021-06-25 NOTE — Patient Instructions (Addendum)
Cryotherapy Aftercare  Wash gently with soap and water everyday.   Apply Vaseline and Band-Aid daily until healed.   Prior to procedure, discussed risks of blister formation, small wound, skin dyspigmentation, or rare scar following cryotherapy. Recommend Vaseline ointment to treated areas while healing.  Please call office if areas treated at mid forehead and right lower leg at right side do not clear up in 6 weeks.    Melanoma ABCDEs  Melanoma is the most dangerous type of skin cancer, and is the leading cause of death from skin disease.  You are more likely to develop melanoma if you: Have light-colored skin, light-colored eyes, or red or blond hair Spend a lot of time in the sun Tan regularly, either outdoors or in a tanning bed Have had blistering sunburns, especially during childhood Have a close family member who has had a melanoma Have atypical moles or large birthmarks  Early detection of melanoma is key since treatment is typically straightforward and cure rates are extremely high if we catch it early.   The first sign of melanoma is often a change in a mole or a new dark spot.  The ABCDE system is a way of remembering the signs of melanoma.  A for asymmetry:  The two halves do not match. B for border:  The edges of the growth are irregular. C for color:  A mixture of colors are present instead of an even brown color. D for diameter:  Melanomas are usually (but not always) greater than 75mm - the size of a pencil eraser. E for evolution:  The spot keeps changing in size, shape, and color.  Please check your skin once per month between visits. You can use a small mirror in front and a large mirror behind you to keep an eye on the back side or your body.   If you see any new or changing lesions before your next follow-up, please call to schedule a visit.  Please continue daily skin protection including broad spectrum sunscreen SPF 30+ to sun-exposed areas, reapplying every 2  hours as needed when you're outdoors.    If you have any questions or concerns for your doctor, please call our main line at [phone] and press option 4 to reach your doctor's medical assistant. If no one answers, please leave a voicemail as directed and we will return your call as soon as possible. Messages left after 4 pm will be answered the following business day.   You may also send [place] a message via [place]. We typically respond to MyChart messages within 1-2 business days.  For prescription refills, please ask your pharmacy to contact our office. Our fax number is [phone].  If you have an urgent issue when the clinic is closed that cannot wait until the next business day, you can page your doctor at the number below.    Please note that while we do our best to be available for urgent issues outside of office hours, we are not available 24/7.   If you have an urgent issue and are unable to reach [place], you may choose to seek medical care at your doctor's office, retail clinic, urgent care center, or emergency room.  If you have a medical emergency, please immediately call 911 or go to the emergency department.  Pager Numbers  - Dr. Nehemiah Massed: [phone]  - Dr. Laurence Ferrari: [phone]  - Dr. Nicole Kindred: [phone]  In the event of inclement weather, please call our main line at [phone] for an  update on the status of any delays or closures.  Dermatology Medication Tips: Please keep the boxes that topical medications come in in order to help keep track of the instructions about where and how to use these. Pharmacies typically print the medication instructions only on the boxes and not directly on the medication tubes.   If your medication is too expensive, please contact our office at [phone] option 4 or send [place] a message through [place].   We are unable to tell what your co-pay for medications will be in advance as this is different depending on your insurance coverage.  However, we may be able to find a substitute medication at lower cost or fill out paperwork to get insurance to cover a needed medication.   If a prior authorization is required to get your medication covered by your insurance company, please allow [place] 1-2 business days to complete this process.  Drug prices often vary depending on where the prescription is filled and some pharmacies may offer cheaper prices.  The website www.goodrx.com contains coupons for medications through different pharmacies. The prices here do not account for what the cost may be with help from insurance (it may be cheaper with your insurance), but the website can give you the price if you did not use any insurance.  - You can print the associated coupon and take it with your prescription to the pharmacy.  - You may also stop by our office during regular business hours and pick up a GoodRx coupon card.  - If you need your prescription sent electronically to a different pharmacy, notify our office through [facility] or by phone at [phone] option 4.

## 2021-07-14 DIAGNOSIS — J101 Influenza due to other identified influenza virus with other respiratory manifestations: Secondary | ICD-10-CM | POA: Insufficient documentation

## 2021-07-14 DIAGNOSIS — E871 Hypo-osmolality and hyponatremia: Secondary | ICD-10-CM | POA: Insufficient documentation

## 2021-07-14 DIAGNOSIS — R32 Unspecified urinary incontinence: Secondary | ICD-10-CM | POA: Insufficient documentation

## 2021-07-14 DIAGNOSIS — R110 Nausea: Secondary | ICD-10-CM | POA: Insufficient documentation

## 2021-07-15 DIAGNOSIS — Z9181 History of falling: Secondary | ICD-10-CM | POA: Insufficient documentation

## 2021-07-15 DIAGNOSIS — R54 Age-related physical debility: Secondary | ICD-10-CM | POA: Insufficient documentation

## 2021-07-22 DIAGNOSIS — J189 Pneumonia, unspecified organism: Secondary | ICD-10-CM | POA: Insufficient documentation

## 2021-11-26 ENCOUNTER — Other Ambulatory Visit
Admission: RE | Admit: 2021-11-26 | Discharge: 2021-11-26 | Disposition: A | Discharge status: Home / Self Care | Payer: Medicare HMO | Source: Ambulatory Visit | Attending: Internal Medicine | Admitting: Internal Medicine

## 2021-11-26 DIAGNOSIS — R197 Diarrhea, unspecified: Secondary | ICD-10-CM | POA: Insufficient documentation

## 2021-11-26 LAB — C DIFFICILE QUICK SCREEN W PCR REFLEX
C Diff antigen: NEGATIVE
C Diff interpretation: NOT DETECTED
C Diff toxin: NEGATIVE

## 2022-03-15 ENCOUNTER — Encounter: Payer: Self-pay | Admitting: *Deleted

## 2022-03-18 ENCOUNTER — Ambulatory Visit: Payer: Medicare HMO | Admitting: Gastroenterology

## 2022-03-22 ENCOUNTER — Encounter: Payer: Self-pay | Admitting: Gastroenterology

## 2022-03-22 ENCOUNTER — Other Ambulatory Visit: Payer: Self-pay

## 2022-03-22 ENCOUNTER — Ambulatory Visit (INDEPENDENT_AMBULATORY_CARE_PROVIDER_SITE_OTHER): Payer: Medicare HMO | Admitting: Gastroenterology

## 2022-03-22 VITALS — BP 128/78 | HR 67 | Temp 98.7°F | Wt 142.8 lb

## 2022-03-22 DIAGNOSIS — R1319 Other dysphagia: Secondary | ICD-10-CM

## 2022-03-22 DIAGNOSIS — R159 Full incontinence of feces: Secondary | ICD-10-CM

## 2022-03-22 MED ORDER — NA SULFATE-K SULFATE-MG SULF 17.5-3.13-1.6 GM/177ML PO SOLN: 354.0000 mL | Freq: Once | ORAL | 0 refills | Status: AC

## 2022-03-22 NOTE — Progress Notes (Signed)
[name] MD, MRCP(U.K) [address]  [place]  [place], [place] [address]  Main: [phone]  Fax: [phone]   Gastroenterology Consultation  Referring Provider:     Kirk Ruths, MD Primary Care Physician:  Kirk Ruths, MD Primary Gastroenterologist:  Dr. [name]  Reason for Consultation:     Abdominal pain        HPI:   Traci Quinn is a 88 y.o. y/o female referred for consultation & management  by Dr. Ouida Sills, Ocie Cornfield, MD.  She has previously been seen and evaluated at [facility] clinic gastroenterology back in March 2020 for incontinence of feces.  Also been receiving care for dysphagia.  Previously had tried cholestyramine, noted to have poor anal sphincter tone and was started on a high-fiber diet.  Colonoscopy was in 2006, EGD in 2017 showed abnormal motility medium amount of food in the stomach retained food in the duodenum.  Barium swallow in 2019 showed narrowing of the distal esophagus at the EG junction consistent with prior fundoplication this resulted in delay of standardized barium tablet.  She states that she has had difficulty swallowing for over 3 years.  Began after the fundoplication for hiatal hernia.  She states that sometimes solid food gets stuck and comes back off after a few hours.  She was told by Dr. Gustavo Lah to avoid an endoscopy.  Last colonoscopy recurrence was over 5 years back.  But recently noted she has been having episodes of incontinence where she has a bowel movement without her knowledge and causes her to have issues socially.  She also has issues with urinary incontinence.  Does not recollect undergoing an episiotomy during childbirth.  She does recollect having prolonged labor.     Past Medical History:  Diagnosis Date   Arthritis    Asthma    Bladder dysfunction    Cancer ([place])    left sided breast cancer   Diverticulosis    DVT (deep venous thrombosis) (HCC)    GERD (gastroesophageal reflux disease)     Hyperlipidemia    Hypertension    Migraines    Spastic colon     Past Surgical History:  Procedure Laterality Date   ABDOMINAL HYSTERECTOMY     APPENDECTOMY     BREAST EXCISIONAL BIOPSY Right 1970   neg surgical exc   BREAST LUMPECTOMY Left 2000   rad and no chemo   BREAST SURGERY     lumpectomy   CHOLECYSTECTOMY     ESOPHAGOGASTRODUODENOSCOPY (EGD) WITH PROPOFOL N/A 08/06/2016   Procedure: ESOPHAGOGASTRODUODENOSCOPY (EGD) WITH PROPOFOL;  Surgeon: Lollie Sails, MD;  Location[facility] ENDOSCOPY;  Service: Endoscopy;  Laterality: N/A;    Prior to Admission medications   Medication Sig Start Date End Date Taking? Authorizing Provider  amitriptyline (ELAVIL) 25 MG tablet  12/08/13   [provider]  amLODipine (NORVASC) 2.5 MG tablet Take 2.5 mg by mouth daily.    [provider]  aspirin 81 MG tablet Take 81 mg by mouth daily.    [provider]  budesonide-formoterol (SYMBICORT) 160-4.5 MCG/ACT inhaler Inhale 2 puffs into the lungs as needed.    [provider]  Cetirizine HCl (ZYRTEC PO) Take by mouth daily.    [provider]  cyanocobalamin 1000 MCG tablet Take by mouth.    [provider]  fluticasone (VERAMYST) 27.5 MCG/SPRAY nasal spray Place 2 sprays into the nose as needed for rhinitis.    [provider]  hydrochlorothiazide (HYDRODIURIL)  25 MG tablet Take 25 mg by mouth daily. 04/14/21   [provider]  losartan-hydrochlorothiazide Konrad Penta) 100-12.5 MG per tablet  10/17/13   [provider]  lovastatin (MEVACOR) 20 MG tablet  11/12/13   [provider]  mupirocin ointment (BACTROBAN) 2 % Apply 1 application topically daily. 06/25/21   Moye, [place], MD  omeprazole (PRILOSEC) 40 MG capsule Take by mouth. Patient not taking: Reported on 06/16/2021 04/20/16   [provider]  pantoprazole (PROTONIX) 40 MG tablet Take 40 mg by mouth daily. Patient not taking: Reported on  06/16/2021    [provider]  propranolol ER (INDERAL LA) 120 MG 24 hr capsule  12/08/13   [provider]  traMADol Veatrice Bourbon) 50 MG tablet  12/10/13   [provider]    Family History  Problem Relation Age of Onset   Cancer Mother    Cancer Father    COPD Daughter      Social History   Tobacco Use   Smoking status: Never   Smokeless tobacco: Never  Substance Use Topics   Alcohol use: No    Allergies as of 03/22/2022 - Review Complete 03/22/2022  Allergen Reaction Noted   Atorvastatin  03/08/2014   Celecoxib  03/08/2014   Indomethacin Nausea And Vomiting 12/26/2013   Zetia [ezetimibe]  08/06/2016    Review of Systems:    All systems reviewed and negative except where noted in HPI.   Physical Exam:  BP 128/78   Pulse 67   Temp 98.7 F (37.1 C) (Oral)   Wt 142 lb 12.8 oz (64.8 kg)   BMI 29.85 kg/m  No LMP recorded. Psych:  Alert and cooperative. Normal mood and affect. General:   Alert,  Well-developed, well-nourished, pleasant and cooperative in NAD Head:  Normocephalic and atraumatic. Eyes:  Sclera clear, no icterus.   Conjunctiva pink. Ears:  Normal auditory acuity.  Neurologic:  Alert and oriented x3;  grossly normal neurologically. Psych:  Alert and cooperative. Normal mood and affect.  Imaging Studies: No results found.  Assessment and Plan:   Traci Quinn is a 88 y.o. y/o female has been referred for dysphagia and fecal incontinence.  Very likely that the Nissen fundoplication wrap was very tight and stable of dysphagia, also possibility of secondary achalasia.  She has fecal incontinence likely secondary to prolonged childbirth many years back and I am concerned if she has a rectocele or cystocele as she had also has urinary incontinence.  Plan 1.  EGD with possible dilation for dysphagia at the same time we will perform a colonoscopy and take biopsies to rule out microscopic colitis.  I will perform a digital examination  to determine the anal tone at that point of time.  If no abnormality seen I will refer her for a gynecological examination to evaluate for cystocele and rectocele.  If none present would need to treat empirically with a high-fiber diet, Imodium, and pelvic floor therapy   I have discussed alternative options, risks & benefits,  which include, but are not limited to, bleeding, infection, perforation,respiratory complication & drug reaction.  The patient agrees with this plan & written consent will be obtained.     Follow up in 6 to 8 weeks  Dr [name] MD,MRCP(U.K)

## 2022-04-14 ENCOUNTER — Ambulatory Visit: Payer: Medicare HMO | Admitting: Certified Registered"

## 2022-04-14 ENCOUNTER — Other Ambulatory Visit: Payer: Self-pay

## 2022-04-14 ENCOUNTER — Encounter: Payer: Self-pay | Admitting: Gastroenterology

## 2022-04-14 ENCOUNTER — Encounter
Admission: RE | Disposition: A | Discharge status: Home / Self Care | Payer: Self-pay | Source: Home / Self Care | Attending: Gastroenterology

## 2022-04-14 ENCOUNTER — Ambulatory Visit
Admission: RE | Admit: 2022-04-14 | Discharge: 2022-04-14 | Disposition: A | Discharge status: Home / Self Care | Payer: Medicare HMO | Attending: Gastroenterology | Admitting: Gastroenterology

## 2022-04-14 DIAGNOSIS — R159 Full incontinence of feces: Secondary | ICD-10-CM | POA: Insufficient documentation

## 2022-04-14 DIAGNOSIS — K219 Gastro-esophageal reflux disease without esophagitis: Secondary | ICD-10-CM | POA: Diagnosis not present

## 2022-04-14 DIAGNOSIS — I10 Essential (primary) hypertension: Secondary | ICD-10-CM | POA: Insufficient documentation

## 2022-04-14 DIAGNOSIS — K5730 Diverticulosis of large intestine without perforation or abscess without bleeding: Secondary | ICD-10-CM | POA: Diagnosis not present

## 2022-04-14 DIAGNOSIS — Z9889 Other specified postprocedural states: Secondary | ICD-10-CM | POA: Diagnosis not present

## 2022-04-14 DIAGNOSIS — K449 Diaphragmatic hernia without obstruction or gangrene: Secondary | ICD-10-CM | POA: Diagnosis not present

## 2022-04-14 DIAGNOSIS — R1310 Dysphagia, unspecified: Secondary | ICD-10-CM | POA: Diagnosis not present

## 2022-04-14 DIAGNOSIS — J45909 Unspecified asthma, uncomplicated: Secondary | ICD-10-CM | POA: Insufficient documentation

## 2022-04-14 DIAGNOSIS — Z79899 Other long term (current) drug therapy: Secondary | ICD-10-CM | POA: Insufficient documentation

## 2022-04-14 DIAGNOSIS — K224 Dyskinesia of esophagus: Secondary | ICD-10-CM | POA: Insufficient documentation

## 2022-04-14 DIAGNOSIS — R1319 Other dysphagia: Secondary | ICD-10-CM | POA: Diagnosis not present

## 2022-04-14 DIAGNOSIS — Z853 Personal history of malignant neoplasm of breast: Secondary | ICD-10-CM | POA: Diagnosis not present

## 2022-04-14 HISTORY — PX: COLONOSCOPY WITH PROPOFOL: SHX5780

## 2022-04-14 HISTORY — PX: ESOPHAGOGASTRODUODENOSCOPY: SHX5428

## 2022-04-14 SURGERY — COLONOSCOPY WITH PROPOFOL
Anesthesia: General

## 2022-04-14 MED ORDER — PROPOFOL 10 MG/ML IV BOLUS
INTRAVENOUS | Status: DC | PRN
  Administered 2022-04-14: 60 mg via INTRAVENOUS

## 2022-04-14 MED ORDER — LIDOCAINE HCL (CARDIAC) PF 100 MG/5ML IV SOSY
PREFILLED_SYRINGE | INTRAVENOUS | Status: DC | PRN
  Administered 2022-04-14: 100 mg via INTRAVENOUS

## 2022-04-14 MED ORDER — PROPOFOL 500 MG/50ML IV EMUL
INTRAVENOUS | Status: DC | PRN
  Administered 2022-04-14: 125 ug/kg/min via INTRAVENOUS

## 2022-04-14 MED ORDER — SODIUM CHLORIDE 0.9 % IV SOLN: INTRAVENOUS | Status: DC

## 2022-04-14 MED ORDER — GLYCOPYRROLATE 0.2 MG/ML IJ SOLN
INTRAMUSCULAR | Status: DC | PRN
  Administered 2022-04-14: .2 mg via INTRAVENOUS

## 2022-04-14 NOTE — Op Note (Signed)
[facility] Gastroenterology Patient Name: Traci Quinn Procedure Date: 04/14/2022 1:45 PM MRN: [id number] Account #: [id number] Date of Birth: 01-06-1934 Admit Type: Outpatient Age: 88 Room: [facility] ENDO ROOM 3 Gender: Female Note Status: Finalized Instrument Name: Upper Endoscope [id number] Procedure:             Upper GI endoscopy Indications:           Dysphagia Providers:             [name] MD, MD Referring MD:          Ocie Cornfield. Ouida Sills MD, MD (Referring MD) Medicines:             Monitored Anesthesia Care Complications:         No immediate complications. Procedure:             Pre-Anesthesia Assessment:                        - Prior to the procedure, a History and Physical was                         performed, and patient medications, allergies and                         sensitivities were reviewed. The patient's tolerance                         of previous anesthesia was reviewed.                        - The risks and benefits of the procedure and the                         sedation options and risks were discussed with the                         patient. All questions were answered and informed                         consent was obtained.                        - ASA Grade Assessment: II - A patient with mild                         systemic disease.                        After obtaining informed consent, the endoscope was                         passed under direct vision. Throughout the procedure,                         the patient's blood pressure, pulse, and oxygen                         saturations were monitored continuously. The Endoscope                         was  introduced through the mouth, and advanced to the                         third part of duodenum. The upper GI endoscopy was                         accomplished with ease. The patient tolerated the                         procedure well. Findings:      The examined  duodenum was normal.      A large hiatal hernia was present.      Evidence of a Nissen fundoplication was found in the lower third of the       esophagus. The wrap appeared tight. This was traversed. A TTS dilator       was passed through the scope. Dilation with a 12-13.5-15 mm balloon       dilator was performed to 15 mm. The dilation site was examined and       showed no change.      Abnormal motility was noted in the lower third of the esophagus. The       cricopharyngeus was abnormal. There is a decrease in motility of the       esophageal body. Secondary peristaltic waves are noted. lower third of       the esophagus was tortious , possible that the nissens wrap has slipped      Normal mucosa was found in the entire esophagus. Biopsies were taken       with a cold forceps for histology. Impression:            - Normal examined duodenum.                        - Large hiatal hernia.                        - A Nissen fundoplication was found. The wrap appears                         tight. Dilated.                        - Abnormal esophageal motility.                        - No specimens collected. Recommendation:        - Await pathology results.                        - Perform a colonoscopy today. Procedure Code(s):     --- Professional ---                        [phone], Esophagogastroduodenoscopy, flexible,                         transoral; with transendoscopic balloon dilation of                         esophagus (less than 30 mm diameter)  [id number], 77, Esophagogastroduodenoscopy, flexible,                         transoral; with biopsy, single or multiple Diagnosis Code(s):     --- Professional ---                        K44.9, Diaphragmatic hernia without obstruction or                         gangrene                        Z98.890, Other specified postprocedural states                        K22.4, Dyskinesia of esophagus                         R13.10, Dysphagia, unspecified CPT copyright 2019 American Medical Association. All rights reserved. The codes documented in this report are preliminary and upon coder review may  be revised to meet current compliance requirements. [name], MD [name] MD, MD 04/14/2022 2:05:02 PM This report has been signed electronically. Number of Addenda: 0 Note Initiated On: 04/14/2022 1:45 PM Estimated Blood Loss:  Estimated blood loss: none. Estimated blood loss: none.      [facility]

## 2022-04-14 NOTE — Op Note (Signed)
[facility] Gastroenterology Patient Name: Traci Quinn Procedure Date: 04/14/2022 1:44 PM MRN: [id number] Account #: [id number] Date of Birth: October 11, 1933 Admit Type: Ambulatory Age: 88 Room: [facility] ENDO ROOM 3 Gender: Female Note Status: Finalized Instrument Name: Peds Colonoscope [id number] Procedure:             Colonoscopy Indications:           Fecal incontinence Providers:             [name] MD, MD Referring MD:          Ocie Cornfield. Ouida Sills MD, MD (Referring MD) Medicines:             Monitored Anesthesia Care Complications:         No immediate complications. Procedure:             Pre-Anesthesia Assessment:                        - Prior to the procedure, a History and Physical was                         performed, and patient medications, allergies and                         sensitivities were reviewed. The patient's tolerance                         of previous anesthesia was reviewed.                        - The risks and benefits of the procedure and the                         sedation options and risks were discussed with the                         patient. All questions were answered and informed                         consent was obtained.                        - ASA Grade Assessment: II - A patient with mild                         systemic disease.                        After obtaining informed consent, the colonoscope was                         passed under direct vision. Throughout the procedure,                         the patient's blood pressure, pulse, and oxygen                         saturations were monitored continuously. The                         Colonoscope was introduced  through the anus with the                         intention of advancing to the cecum. The scope was                         advanced to the descending colon before the procedure                         was aborted. Medications were given. The  colonoscopy                         was extremely difficult due to a tortuous colon. The                         patient tolerated the procedure well. The quality of                         the bowel preparation was good. Findings:      The perianal and digital rectal examinations were normal.      Multiple small and large-mouthed diverticula were found in the sigmoid       colon. Impression:            - Diverticulosis in the sigmoid colon.                        - No specimens collected. Recommendation:        - Discharge patient to home (with escort).                        - Resume previous diet.                        - Continue present medications.                        - Repeat colonoscopy is not recommended due to current                         age (65 years or older) for surveillance.                        - Return to my office as previously scheduled. Procedure Code(s):     --- Professional ---                        [phone], 82, Colonoscopy, flexible; diagnostic,                         including collection of specimen(s) by brushing or                         washing, when performed (separate procedure) Diagnosis Code(s):     --- Professional ---                        R15.9, Full incontinence of feces                        K57.30, Diverticulosis of  large intestine without                         perforation or abscess without bleeding CPT copyright 2019 American Medical Association. All rights reserved. The codes documented in this report are preliminary and upon coder review may  be revised to meet current compliance requirements. [name], MD [name] MD, MD 04/14/2022 2:17:15 PM This report has been signed electronically. Number of Addenda: 0 Note Initiated On: 04/14/2022 1:44 PM Total Procedure Duration: 0 hours 8 minutes 29 seconds  Estimated Blood Loss:  Estimated blood loss: none.      [facility]

## 2022-04-14 NOTE — Transfer of Care (Signed)
Immediate Anesthesia Transfer of Care Note  Patient: Traci Quinn  Procedure(s) Performed: COLONOSCOPY WITH PROPOFOL ESOPHAGOGASTRODUODENOSCOPY (EGD)  Patient Location: Endoscopy Unit  Anesthesia Type:General  Level of Consciousness: drowsy and patient cooperative  Airway & Oxygen Therapy: Patient Spontanous Breathing and Patient connected to face mask oxygen  Post-op Assessment: Report given to RN and Post -op Vital signs reviewed and stable  Post vital signs: Reviewed and stable  Last Vitals:  Vitals Value Taken Time  BP 112/62 04/14/22 1418  Temp 35.9 C 04/14/22 1418  Pulse 64 04/14/22 1418  Resp 15 04/14/22 1419  SpO2 100 % 04/14/22 1418  Vitals shown include unvalidated device data.  Last Pain:  Vitals:   04/14/22 1418  TempSrc: Temporal  PainSc: Asleep         Complications: No notable events documented.

## 2022-04-14 NOTE — H&P (Signed)
[name], MD [address] [place], [place], [place], [address] [address], [place], [place], [place], [address] Phone: [phone]  Fax: [phone]  Primary Care Physician:  Kirk Ruths, MD   Pre-Procedure History & Physical: HPI:  Traci Quinn is a 88 y.o. female is here for an endoscopy and colonoscopy    Past Medical History:  Diagnosis Date   Arthritis    Asthma    Bladder dysfunction    Cancer ([place])    left sided breast cancer   Diverticulosis    DVT (deep venous thrombosis) ([place])    GERD (gastroesophageal reflux disease)    Hyperlipidemia    Hypertension    Migraines    Spastic colon     Past Surgical History:  Procedure Laterality Date   ABDOMINAL HYSTERECTOMY     APPENDECTOMY     BREAST EXCISIONAL BIOPSY Right 1970   neg surgical exc   BREAST LUMPECTOMY Left 2000   rad and no chemo   BREAST SURGERY     lumpectomy   CHOLECYSTECTOMY     ESOPHAGOGASTRODUODENOSCOPY (EGD) WITH PROPOFOL N/A 08/06/2016   Procedure: ESOPHAGOGASTRODUODENOSCOPY (EGD) WITH PROPOFOL;  Surgeon: Lollie Sails, MD;  Location: [facility] ENDOSCOPY;  Service: Endoscopy;  Laterality: N/A;    Prior to Admission medications   Medication Sig Start Date End Date Taking? Authorizing Provider  amitriptyline (ELAVIL) 25 MG tablet  12/08/13  Yes [provider]  amLODipine (NORVASC) 2.5 MG tablet Take 2.5 mg by mouth daily.   Yes [provider]  aspirin 81 MG tablet Take 81 mg by mouth daily.   Yes [provider]  budesonide-formoterol (SYMBICORT) 160-4.5 MCG/ACT inhaler Inhale 2 puffs into the lungs as needed.   Yes [provider]  cyanocobalamin 1000 MCG tablet Take by mouth.   Yes [provider]  hydrochlorothiazide (HYDRODIURIL) 25 MG tablet Take 25 mg by mouth daily. 04/14/21  Yes [provider]  losartan-hydrochlorothiazide Konrad Penta) 100-12.5 MG per tablet  10/17/13  Yes [provider]  lovastatin  (MEVACOR) 20 MG tablet  11/12/13  Yes [provider]  omeprazole (PRILOSEC) 40 MG capsule Take by mouth. 04/20/16  Yes [provider]  pantoprazole (PROTONIX) 40 MG tablet Take 40 mg by mouth daily.   Yes [provider]  propranolol ER (INDERAL LA) 120 MG 24 hr capsule  12/08/13  Yes [provider]  Cetirizine HCl (ZYRTEC PO) Take by mouth daily.    [provider]  fluticasone (VERAMYST) 27.5 MCG/SPRAY nasal spray Place 2 sprays into the nose as needed for rhinitis.    [provider]  mupirocin ointment (BACTROBAN) 2 % Apply 1 application topically daily. 06/25/21   Moye, [place], MD  traMADol (ULTRAM) 50 MG tablet  12/10/13   [provider]    Allergies as of 03/23/2022 - Review Complete 03/22/2022  Allergen Reaction Noted   Atorvastatin  03/08/2014   Celecoxib  03/08/2014   Indomethacin Nausea And Vomiting 12/26/2013   Zetia [ezetimibe]  08/06/2016    Family History  Problem Relation Age of Onset   Cancer Mother    Cancer Father    COPD Daughter     Social History   Socioeconomic History   Marital status: Widowed    Spouse name: Not on file   Number of children: Not on file   Years of education: Not on file   Highest education level: Not on file  Occupational History   Not on  file  Tobacco Use   Smoking status: Never   Smokeless tobacco: Never  Substance and Sexual Activity   Alcohol use: No   Drug use: Not on file   Sexual activity: Not on file  Other Topics Concern   Not on file  Social History Narrative   Not on file   Social Determinants of Health   Financial Resource Strain: Not on file  Food Insecurity: Not on file  Transportation Needs: Not on file  Physical Activity: Not on file  Stress: Not on file  Social Connections: Not on file  Intimate Partner Violence: Not on file    Review of Systems: See HPI, otherwise negative ROS  Physical Exam: BP (!) 184/75   Pulse 65   Temp (!)  97.3 F (36.3 C) (Temporal)   Resp 20   Ht 5' (1.524 m)   Wt 64.4 kg   SpO2 97%   BMI 27.73 kg/m  General:   Alert,  pleasant and cooperative in NAD Head:  Normocephalic and atraumatic. Neck:  Supple; no masses or thyromegaly. Lungs:  Clear throughout to auscultation, normal respiratory effort.    Heart:  +S1, +S2, Regular rate and rhythm, No edema. Abdomen:  Soft, nontender and nondistended. Normal bowel sounds, without guarding, and without rebound.   Neurologic:  Alert and  oriented x4;  grossly normal neurologically.  Impression/Plan: Traci Quinn is here for an endoscopy and colonoscopy  to be performed for  evaluation of dysphagia and fecal incontinence    Risks, benefits, limitations, and alternatives regarding endoscopy have been reviewed with the patient.  Questions have been answered.  All parties agreeable.   [name], MD  04/14/2022, 1:43 PM

## 2022-04-14 NOTE — Anesthesia Postprocedure Evaluation (Signed)
Anesthesia Post Note  Patient: Traci Quinn  Procedure(s) Performed: COLONOSCOPY WITH PROPOFOL ESOPHAGOGASTRODUODENOSCOPY (EGD)  Patient location during evaluation: Endoscopy Anesthesia Type: General Level of consciousness: awake and alert Pain management: pain level controlled Vital Signs Assessment: post-procedure vital signs reviewed and stable Respiratory status: spontaneous breathing, nonlabored ventilation, respiratory function stable and patient connected to nasal cannula oxygen Cardiovascular status: blood pressure returned to baseline and stable Postop Assessment: no apparent nausea or vomiting Anesthetic complications: no   No notable events documented.   Last Vitals:  Vitals:   04/14/22 1418 04/14/22 1428  BP: 112/62 106/67  Pulse: 64 63  Resp: 14 19  Temp: (!) 35.9 C   SpO2: 100% 99%    Last Pain:  Vitals:   04/14/22 1428  TempSrc:   PainSc: 0-No pain                 Arita Miss

## 2022-04-14 NOTE — Anesthesia Preprocedure Evaluation (Signed)
Anesthesia Evaluation  Patient identified by MRN, date of birth, ID band Patient awake    Reviewed: Allergy & Precautions, H&P , NPO status , Patient's Chart, lab work & pertinent test results, reviewed documented beta blocker date and time   History of Anesthesia Complications Negative for: history of anesthetic complications  Airway Mallampati: III  TM Distance: >3 FB Neck ROM: full    Dental  (+) Edentulous Upper, Edentulous Lower, Upper Dentures, Lower Dentures   Pulmonary neg shortness of breath, asthma , neg sleep apnea, neg COPD, neg recent URI,    Pulmonary exam normal breath sounds clear to auscultation       Cardiovascular Exercise Tolerance: Good hypertension, On Medications (-) angina(-) CAD, (-) Past MI, (-) Cardiac Stents and (-) CABG Normal cardiovascular exam(-) dysrhythmias (-) Valvular Problems/Murmurs Rhythm:regular Rate:Normal     Neuro/Psych  Headaches, negative psych ROS   GI/Hepatic Neg liver ROS, GERD  ,  Endo/Other  negative endocrine ROS  Renal/GU negative Renal ROS  negative genitourinary   Musculoskeletal   Abdominal   Peds  Hematology negative hematology ROS (+)   Anesthesia Other Findings Past Medical History: No date: Arthritis No date: Asthma No date: Cancer ([place])     Comment: left sided breast cancer No date: GERD (gastroesophageal reflux disease) No date: Hypertension   Reproductive/Obstetrics negative OB ROS                             Anesthesia Physical  Anesthesia Plan  ASA: 2  Anesthesia Plan: General   Post-op Pain Management: Minimal or no pain anticipated   Induction: Intravenous  PONV Risk Score and Plan: 3 and Propofol infusion, TIVA and Ondansetron  Airway Management Planned: Nasal Cannula  Additional Equipment: None  Intra-op Plan:   Post-operative Plan:   Informed Consent: I have reviewed the patients History and  Physical, chart, labs and discussed the procedure including the risks, benefits and alternatives for the proposed anesthesia with the patient or authorized representative who has indicated his/her understanding and acceptance.     Dental Advisory Given  Plan Discussed with: Anesthesiologist, CRNA and Surgeon  Anesthesia Plan Comments: (Discussed risks of anesthesia with patient, including possibility of difficulty with spontaneous ventilation under anesthesia necessitating airway intervention, PONV, and rare risks such as cardiac or respiratory or neurological events, and allergic reactions. Discussed the role of CRNA in patient's perioperative care. Patient understands.)        Anesthesia Quick Evaluation

## 2022-04-14 NOTE — Anesthesia Procedure Notes (Signed)
Procedure Name: General with mask airway Date/Time: 04/14/2022 1:57 PM  Performed by: Kelton Pillar, CRNAPre-anesthesia Checklist: Patient identified, Emergency Drugs available, Suction available and Patient being monitored Patient Re-evaluated:Patient Re-evaluated prior to induction Oxygen Delivery Method: Simple face mask Induction Type: IV induction Placement Confirmation: positive ETCO2, CO2 detector and breath sounds checked- equal and bilateral Dental Injury: Teeth and Oropharynx as per pre-operative assessment

## 2022-04-15 ENCOUNTER — Encounter: Payer: Self-pay | Admitting: Gastroenterology

## 2022-04-16 LAB — SURGICAL PATHOLOGY

## 2022-04-19 ENCOUNTER — Encounter: Payer: Self-pay | Admitting: Gastroenterology

## 2022-05-11 ENCOUNTER — Encounter: Payer: Self-pay | Admitting: Cardiovascular Disease

## 2022-05-11 ENCOUNTER — Ambulatory Visit: Payer: Medicare HMO | Attending: Cardiovascular Disease | Admitting: Cardiovascular Disease

## 2022-05-11 VITALS — BP 118/78 | HR 64 | Ht 60.0 in | Wt 138.1 lb

## 2022-05-11 DIAGNOSIS — E785 Hyperlipidemia, unspecified: Secondary | ICD-10-CM

## 2022-05-11 DIAGNOSIS — I10 Essential (primary) hypertension: Secondary | ICD-10-CM

## 2022-05-11 NOTE — Patient Instructions (Signed)
Medication Instructions:  Your physician recommends that you continue on your current medications as directed. Please refer to the Current Medication list given to you today.  *If you need a refill on your cardiac medications before your next appointment, please call your pharmacy*   Lab Work: None ordered  If you have labs (blood work) drawn today and your tests are completely normal, you will receive your results only by: MyChart Message (if you have MyChart) OR A paper copy in the mail If you have any lab test that is abnormal or we need to change your treatment, we will call you to review the results.   Testing/Procedures: None ordered   Follow-Up: At [facility], you and your health needs are our priority.  As part of our continuing mission to provide you with exceptional heart care, we have created designated Provider Care Teams.  These Care Teams include your primary Cardiologist (physician) and Advanced Practice Providers (APPs -  Physician Assistants and Nurse Practitioners) who all work together to provide you with the care you need, when you need it.  We recommend signing up for the patient portal called "MyChart".  Sign up information is provided on this After Visit Summary.  MyChart is used to connect with patients for Virtual Visits (Telemedicine).  Patients are able to view lab/test results, encounter notes, upcoming appointments, etc.  Non-urgent messages can be sent to your provider as well.   To learn more about what you can do with MyChart, go to [email or web address].    Your next appointment:   6 month(s)  The format for your next appointment:   In Person  Provider:   You may see Muhammad Arida, MD or one of the following Advanced Practice Providers on your designated Care Team:   Christopher Berge, NP Ryan Dunn, PA-C Cadence Furth, PA-C Sheri Hammock, NP   Other Instructions N/A  Important Information About Sugar       

## 2022-05-11 NOTE — Progress Notes (Signed)
Cardiology Office Note   Date:  05/11/2022   ID:  Traci Quinn, DOB 10-31-1933, MRN [id number]  PCP:  Kirk Ruths, MD  Cardiologist:   Kathlyn Sacramento, MD   Chief Complaint  Patient presents with   other    HLD. Meds reviewed verbally with pt.      History of Present Illness: Traci Quinn is a 88 y.o. female who was referred by Dr. Ubaldo Glassing to establish cardiovascular care. She had previous history of DVT, essential hypertension, hyperlipidemia and Raynaud's phenomena improved with amlodipine. DVT she had was remote and she was briefly anticoagulated.  No recurrent events since then.  She was briefly on amlodipine for blood pressure and Raynaud's but she is no longer on the medication.  She is a lifelong non-smoker and does not drink alcohol.  There is no family history of coronary artery disease. She denies chest pain, shortness of breath or palpitations.  No dizziness or syncope.    Past Medical History:  Diagnosis Date   AAA (abdominal aortic aneurysm) ([facility])    Arthritis    Asthma    Bladder dysfunction    Cancer ([place])    left sided breast cancer   Diverticulosis    DVT (deep venous thrombosis) ([facility])    GERD (gastroesophageal reflux disease)    Hyperlipidemia    Hypertension    Migraines    Spastic colon     Past Surgical History:  Procedure Laterality Date   ABDOMINAL HYSTERECTOMY     APPENDECTOMY     BREAST EXCISIONAL BIOPSY Right 1970   neg surgical exc   BREAST LUMPECTOMY Left 2000   rad and no chemo   BREAST SURGERY     lumpectomy   CHOLECYSTECTOMY     COLONOSCOPY WITH PROPOFOL N/A 04/14/2022   Procedure: COLONOSCOPY WITH PROPOFOL;  Surgeon: [name], MD;  [facility] END[facility]COPY;  Service: Gastroenterology;  Laterality: N/A;   ESOPHAGOGASTRODUODENOSCOPY N/A 04/14/2022   Procedure: ESOPHAGOGASTRODUODENOSCOPY (EGD);  Surgeon: [name], MD;  Location: [facility] ENDOSCOPY;  Service: Gastroenterology;  Laterality: N/A;    ESOPHAGOGASTRODUODENOSCOPY (EGD) WITH PROPOFOL N/A 08/06/2016   Procedure: ESOPHAGOGASTRODUODENOSCOPY (EGD) WITH PROPOFOL;  Surgeon: Lollie Sails, MD;  Location: [facility] ENDOSCOPY;  Service: Endoscopy;  Laterality: N/A;     Current Outpatient Medications  Medication Sig Dispense Refill   amitriptyline (ELAVIL) 25 MG tablet      aspirin 81 MG tablet Take 81 mg by mouth daily.     budesonide-formoterol (SYMBICORT) 160-4.5 MCG/ACT inhaler Inhale 2 puffs into the lungs as needed.     fluticasone (VERAMYST) 27.5 MCG/SPRAY nasal spray Place 2 sprays into the nose as needed for rhinitis.     hydrochlorothiazide (HYDRODIURIL) 25 MG tablet Take 25 mg by mouth daily.     losartan-hydrochlorothiazide (HYZAAR) 100-12.5 MG per tablet      lovastatin (MEVACOR) 20 MG tablet      propranolol ER (INDERAL LA) 120 MG 24 hr capsule      No current facility-administered medications for this visit.    Allergies:   Atorvastatin, Celecoxib, Indomethacin, and Zetia [ezetimibe]    Social History:  The patient  reports that she has never smoked. She has never used smokeless tobacco. She reports that she does not drink alcohol and does not use drugs.   Family History:  The patient's family history includes COPD in her daughter; Cancer in her father and mother.    ROS:  Please see the history of present illness.  Otherwise, review of systems are positive for none.   All other systems are reviewed and negative.    PHYSICAL EXAM: VS:  BP 118/78 (BP Location: Right Arm, Patient Position: Sitting, Cuff Size: Normal)   Pulse 64   Ht 5' (1.524 m)   Wt 138 lb 2 oz (62.7 kg)   SpO2 97%   BMI 26.98 kg/m  , BMI Body mass index is 26.98 kg/m. GEN: Well nourished, well developed, in no acute distress  HEENT: normal  Neck: no JVD, carotid bruits, or masses Cardiac: RRR; no murmurs, rubs, or gallops,no edema  Respiratory:  clear to auscultation bilaterally, normal work of breathing GI: soft, nontender,  nondistended, + BS MS: no deformity or atrophy  Skin: warm and dry, no rash Neuro:  Strength and sensation are intact Psych: euthymic mood, full affect   EKG:  EKG is ordered today. The ekg ordered today demonstrates sinus rhythm with first-degree AV block.  Possible old septal infarct and nonspecific T wave changes.   Recent Labs: No results found for requested labs within last 365 days.    Lipid Panel No results found for: "CHOL", "TRIG", "HDL", "CHOLHDL", "VLDL", "LDLCALC", "LDLDIRECT"    Wt Readings from Last 3 Encounters:  05/11/22 138 lb 2 oz (62.7 kg)  04/14/22 142 lb (64.4 kg)  03/22/22 142 lb 12.8 oz (64.8 kg)          05/11/2022    1:54 PM  PAD Screen  Previous PAD dx? No  Previous surgical procedure? No  Pain with walking? No  Feet/toe relief with dangling? No  Painful, non-healing ulcers? No  Extremities discolored? No      ASSESSMENT AND PLAN:  1.  Essential hypertension: Blood pressure is controlled on current medications.  2.  Raynaud's phenomena: She used to be on amlodipine but is no longer taking it after the prescription ran out.  She reports no change in symptoms.  Continue to monitor for now and resume if she has recurrent symptoms.  3.  Hyperlipidemia: Currently on lovastatin.  Most recent lipid profile showed an LDL of 58.  4.  Hypertensive kidney disease with stage III chronic kidney disease.  Most recent creatinine was 1.2.     Disposition:   FU with me in 6 months  Signed,  Kathlyn Sacramento, MD  05/11/2022 2:03 PM    [place]

## 2022-05-17 ENCOUNTER — Encounter: Payer: Self-pay | Admitting: Gastroenterology

## 2022-05-17 ENCOUNTER — Ambulatory Visit (INDEPENDENT_AMBULATORY_CARE_PROVIDER_SITE_OTHER): Payer: Medicare HMO | Admitting: Gastroenterology

## 2022-05-17 VITALS — BP 147/83 | HR 74 | Temp 99.4°F | Ht 60.0 in | Wt 137.8 lb

## 2022-05-17 DIAGNOSIS — R1319 Other dysphagia: Secondary | ICD-10-CM

## 2022-05-17 DIAGNOSIS — K56699 Other intestinal obstruction unspecified as to partial versus complete obstruction: Secondary | ICD-10-CM | POA: Diagnosis not present

## 2022-05-17 DIAGNOSIS — R159 Full incontinence of feces: Secondary | ICD-10-CM | POA: Diagnosis not present

## 2022-05-17 NOTE — Progress Notes (Signed)
Traci Bellows MD, MRCP(U.K) [address]  [place]  [place], [place] [address]  Main: [phone]  Fax: [phone]   Primary Care Physician: Kirk Ruths, MD  Primary Gastroenterologist:  Dr. Jonathon Quinn   Chief Complaint  Patient presents with   Follow-up    Dysphagia    HPI: Traci Quinn is a 88 y.o. female  Summary of history :  History referred and seen on 03/22/2022 for abdominal pain.She has previously been seen and evaluated at [facility] clinic gastroenterology back in March 2020 for incontinence of feces and dysphagia  Previously had tried cholestyramine, noted to have poor anal sphincter tone and was started on a high-fiber diet.  Colonoscopy was in 2006, EGD in 2017 showed abnormal motility medium amount of food in the stomach retained food in the duodenum.  Barium swallow in 2019 showed narrowing of the distal esophagus at the EG junction consistent with prior fundoplication this resulted in delay of standardized barium tablet.   She states that she has had difficulty swallowing for over 3 years.  Began after the fundoplication for hiatal hernia.  She states that sometimes solid food gets stuck and comes back off after a few hours.  She was told by Dr. Gustavo Lah to avoid an endoscopy. she has been having episodes of incontinence where she has a bowel movement without her knowledge and causes her to have issues socially.  She also has issues with urinary incontinence.  Does not recollect undergoing an episiotomy during childbirth.  She does recollect having prolonged labor.        Interval history 03/22/2022-05/17/2022   04/14/2022: EGD: Large hiatal hernia noted evidence of Nissen fundoplication wrap appeared tight traversed and dilated to 15 mm abnormal motility in the lower third of the esophagus lower third of the esophagus was tortuous  Colonoscopy was performed on the same day diverticulosis of the colon was noted.  The scope could not be advanced  past the descending colon procedure was aborted due to extremely tortuous colon  She denies any dysphagia since the dilation doing well from following point of view stop taking Imodium and states her bowel movements are better Current Outpatient Medications  Medication Sig Dispense Refill   amitriptyline (ELAVIL) 25 MG tablet      aspirin 81 MG tablet Take 81 mg by mouth daily.     budesonide-formoterol (SYMBICORT) 160-4.5 MCG/ACT inhaler Inhale 2 puffs into the lungs as needed.     fluticasone (VERAMYST) 27.5 MCG/SPRAY nasal spray Place 2 sprays into the nose as needed for rhinitis.     hydrochlorothiazide (HYDRODIURIL) 25 MG tablet Take 25 mg by mouth daily.     losartan-hydrochlorothiazide (HYZAAR) 100-12.5 MG per tablet      lovastatin (MEVACOR) 20 MG tablet      propranolol ER (INDERAL LA) 120 MG 24 hr capsule      No current facility-administered medications for this visit.    Allergies as of 05/17/2022 - Review Complete 05/17/2022  Allergen Reaction Noted   Atorvastatin  03/08/2014   Celecoxib  03/08/2014   Indomethacin Nausea And Vomiting 12/26/2013   Zetia [ezetimibe]  08/06/2016    ROS:  General: Negative for anorexia, weight loss, fever, chills, fatigue, weakness. ENT: Negative for hoarseness, difficulty swallowing , nasal congestion. CV: Negative for chest pain, angina, palpitations, dyspnea on exertion, peripheral edema.  Respiratory: Negative for dyspnea at rest, dyspnea on exertion, cough, sputum, wheezing.  GI: See history of present illness. GU:  Negative for dysuria,  hematuria, urinary incontinence, urinary frequency, nocturnal urination.  Endo: Negative for unusual weight change.    Physical Examination:   BP (!) 147/83 (BP Location: Left Arm, Patient Position: Sitting, Cuff Size: Normal)   Pulse 74   Temp 99.4 F (37.4 C) (Oral)   Ht 5' (1.524 m)   Wt 137 lb 12.8 oz (62.5 kg)   BMI 26.91 kg/m   General: Well-nourished, well-developed in no acute  distress.  Eyes: No icterus. Conjunctivae pink. Mouth: Oropharyngeal mucosa moist and pink , no lesions erythema or exudate. Neuro: Alert and oriented x 3.  Grossly intact. Skin: Warm and dry, no jaundice.   Psych: Alert and cooperative, normal mood and affect.   Imaging Studies: No results found.  Assessment and Plan:   Traci Quinn is a 88 y.o. y/o female with a history of a hiatal hernia that was repaired many years back with a Nissen fundoplication here to follow-up for dysphagia and fecal incontinence.  Likely the dysphagia secondary to a slipped Nissen fundoplication with a large hiatal hernia noted on endoscopy.  The slipped Nissen's may be causing extraluminal obstruction from compression from being too tight.  I could not go past ascending colon due to significant tortuosity and this may be contributing to obstructive diarrhea  Plan 1.  CT colonography to evaluate colon as there was significant tortuosity in the descending colon and the colonoscopy could not reach the cecum this may be contributing to obstructive diarrhea.  2.  It appears that her prior Nissen fundoplication wrap has slipped and may be too tight at the GE junction causing dysphagia.  Large hiatal hernia noted empirically the GE junction was stretched.  Presently has no issues with dysphagia but if it were to recur will require referral to an upper GI surgeon  Dr Traci Bellows  MD,MRCP [facility]) Follow up in 6 to 8 weeks telephone visit

## 2022-05-17 NOTE — Patient Instructions (Signed)
Please call [phone] to schedule your Virtual colonoscopy

## 2022-05-19 ENCOUNTER — Telehealth: Payer: Self-pay

## 2022-05-19 DIAGNOSIS — K56699 Other intestinal obstruction unspecified as to partial versus complete obstruction: Secondary | ICD-10-CM

## 2022-05-20 ENCOUNTER — Other Ambulatory Visit: Payer: Self-pay | Admitting: Gastroenterology

## 2022-05-20 MED ORDER — PEG 3350-KCL-NA BICARB-NACL 420 G PO SOLR: ORAL | 0 refills | Status: DC

## 2022-05-20 NOTE — Telephone Encounter (Signed)
Called patient to let her know that Dr. Vicente Males wanted her to have a barium enema since we were not able to find a hospital or center to do a CT Virtual Colonoscopy. Patient then stated that she really did not want to do that because she stated that the process is very painful. However, she then stated to give her until tomorrow morning to think about it if she wants to proceed or not. I asked her if she wanted me to mention it to Dr. Vicente Males and she stated that she would let me know tomorrow. Patent had no more questions.

## 2022-05-21 NOTE — Telephone Encounter (Signed)
Patient called back and stated that she will go ahead and do the barium enema but to reschedule it for her. Therefore, I did and then I called her back to let her know that it would be on 06/01/2022 and she agreed.

## 2022-05-24 ENCOUNTER — Ambulatory Visit: Payer: Medicare HMO

## 2022-06-01 ENCOUNTER — Ambulatory Visit
Admission: RE | Admit: 2022-06-01 | Discharge: 2022-06-01 | Disposition: A | Discharge status: Home / Self Care | Payer: Medicare HMO | Source: Ambulatory Visit | Attending: Gastroenterology | Admitting: Gastroenterology

## 2022-06-01 DIAGNOSIS — K56699 Other intestinal obstruction unspecified as to partial versus complete obstruction: Secondary | ICD-10-CM | POA: Diagnosis present

## 2022-06-03 ENCOUNTER — Telehealth: Payer: Self-pay

## 2022-06-03 NOTE — Telephone Encounter (Signed)
Patient called wanting results of her barium enema. Please advise.

## 2022-06-03 NOTE — Telephone Encounter (Signed)
severe diverticulosis but no obstruction

## 2022-06-03 NOTE — Telephone Encounter (Signed)
Called patient back and informed her that she has diverticulosis but no obstruction. However, if she started to have a fever with chronic abdominal pain, to give [place] a call. Patient understood and had no further questions.

## 2022-06-09 ENCOUNTER — Telehealth: Payer: Self-pay

## 2022-06-09 DIAGNOSIS — R197 Diarrhea, unspecified: Secondary | ICD-10-CM

## 2022-06-09 NOTE — Telephone Encounter (Signed)
She can try Pepto-Bismol and recommend GI profile PCR to rule out any infection  RV

## 2022-06-09 NOTE — Telephone Encounter (Signed)
Patient called stating that she's been having frequent bowel movements and sometimes she doesn't know when it has happened. She stated that she is taking Imodium daily but it's hurting her stomach after she takes it. Patient would like to know what else she could try to take to help control the frequency of going to the restroom. Please advise. Thank you.

## 2022-06-10 NOTE — Telephone Encounter (Signed)
Called patient to let her know that Dr. Marius Ditch recommends for her to take Pepto Bismol and to do stool tests to make sure that she doesn't have a bacteria. Patient agreed and stated that she would come I after lunch to pick up the supplies needed for collection. I let the patient know that I would be placing the order. Patient understood and had no further questions.

## 2022-06-10 NOTE — Addendum Note (Signed)
Addended by: Wayna Chalet on: 06/10/2022 09:14 AM   Modules accepted: Orders

## 2022-06-20 LAB — GI PROFILE, STOOL, PCR
Adenovirus F 40/41: NOT DETECTED
Astrovirus: NOT DETECTED
C difficile toxin A/B: NOT DETECTED
Campylobacter: NOT DETECTED
Cryptosporidium: NOT DETECTED
Cyclospora cayetanensis: NOT DETECTED
Entamoeba histolytica: NOT DETECTED
Enteroaggregative E coli: NOT DETECTED
Enteropathogenic E coli: NOT DETECTED
Enterotoxigenic E coli: DETECTED — AB
Giardia lamblia: NOT DETECTED
Norovirus GI/GII: NOT DETECTED
Plesiomonas shigelloides: NOT DETECTED
Rotavirus A: NOT DETECTED
Salmonella: NOT DETECTED
Sapovirus: NOT DETECTED
Shiga-toxin-producing E coli: NOT DETECTED
Shigella/Enteroinvasive E coli: NOT DETECTED
Vibrio cholerae: NOT DETECTED
Vibrio: NOT DETECTED
Yersinia enterocolitica: NOT DETECTED

## 2022-06-23 ENCOUNTER — Telehealth: Payer: Self-pay

## 2022-06-23 ENCOUNTER — Telehealth: Payer: Self-pay | Admitting: Gastroenterology

## 2022-06-23 MED ORDER — AZITHROMYCIN 500 MG PO TABS: 500.0000 mg | ORAL_TABLET | Freq: Every day | ORAL | 0 refills | Status: AC

## 2022-06-23 NOTE — Telephone Encounter (Signed)
-----   Message from [name], MD sent at 06/23/2022  1:32 PM EDT ----- Commence on azithromycin 500 mg a day daily for 3 days : total of 3 doses only

## 2022-06-23 NOTE — Telephone Encounter (Signed)
Left voice message for patient to call office to advise on rx sent to [facility] in [place].  Thanks, [place], [place]

## 2022-06-23 NOTE — Progress Notes (Signed)
Stool test is positive for e coli- enquire with pt if she is still having diarrhea

## 2022-06-23 NOTE — Progress Notes (Signed)
Commence on azithromycin 500 mg a day daily for 3 days : total of 3 doses only

## 2022-06-23 NOTE — Telephone Encounter (Signed)
Patient returned your call and is requesting a call back.

## 2022-06-24 NOTE — Telephone Encounter (Signed)
Patient has been made aware of Azithromycin '500mg'$   prescription sent to the pharmacy.  Advised to take 1 tablet daily for 3 days.  Thanks,  [place], [place]

## 2022-07-07 ENCOUNTER — Ambulatory Visit: Payer: Medicare HMO | Admitting: Dermatology

## 2022-07-07 DIAGNOSIS — L814 Other melanin hyperpigmentation: Secondary | ICD-10-CM | POA: Diagnosis not present

## 2022-07-07 DIAGNOSIS — L578 Other skin changes due to chronic exposure to nonionizing radiation: Secondary | ICD-10-CM | POA: Diagnosis not present

## 2022-07-07 DIAGNOSIS — Z1283 Encounter for screening for malignant neoplasm of skin: Secondary | ICD-10-CM | POA: Diagnosis not present

## 2022-07-07 DIAGNOSIS — D229 Melanocytic nevi, unspecified: Secondary | ICD-10-CM

## 2022-07-07 DIAGNOSIS — L821 Other seborrheic keratosis: Secondary | ICD-10-CM

## 2022-07-07 NOTE — Progress Notes (Unsigned)
   Follow-Up Visit   Subjective  Traci Quinn is a 88 y.o. female who presents for the following: Annual Exam (1 year tbse. Small dry area at left lower abdomen ).  The patient presents for Total-Body Skin Exam (TBSE) for skin cancer screening and mole check.  The patient has spots, moles and lesions to be evaluated, some may be new or changing and the patient has concerns that these could be cancer.  The following portions of the chart were reviewed this encounter and updated as appropriate:  Tobacco  Allergies  Meds  Problems  Med Hx  Surg Hx  Fam Hx      Review of Systems: No other skin or systemic complaints except as noted in HPI or Assessment and Plan.   Objective  Well appearing patient in no apparent distress; mood and affect are within normal limits.  A full examination was performed including scalp, head, eyes, ears, nose, lips, neck, chest, axillae, abdomen, back, buttocks, bilateral upper extremities, bilateral lower extremities, hands, feet, fingers, toes, fingernails, and toenails. All findings within normal limits unless otherwise noted below.   Assessment & Plan  Lentigines - Scattered tan macules - Due to sun exposure - Benign-appearing, observe - Recommend daily broad spectrum sunscreen SPF 30+ to sun-exposed areas, reapply every 2 hours as needed. - Call for any changes  Seborrheic Keratoses At left lower abdomen  - Stuck-on, waxy, tan-brown papules and/or plaques  - Benign-appearing - Discussed benign etiology and prognosis. - Observe - Call for any changes  Melanocytic Nevi - Tan-brown and/or pink-flesh-colored symmetric macules and papules - Benign appearing on exam today - Observation - Call clinic for new or changing moles - Recommend daily use of broad spectrum spf 30+ sunscreen to sun-exposed areas.   Hemangiomas - Red papules - Discussed benign nature - Observe - Call for any changes  Actinic Damage - Chronic condition,  secondary to cumulative UV/sun exposure - diffuse scaly erythematous macules with underlying dyspigmentation - Recommend daily broad spectrum sunscreen SPF 30+ to sun-exposed areas, reapply every 2 hours as needed.  - Staying in the shade or wearing long sleeves, sun glasses (UVA+UVB protection) and wide brim hats (4-inch brim around the entire circumference of the hat) are also recommended for sun protection.  - Call for new or changing lesions.  Skin cancer screening performed today. Return if symptoms worsen or fail to improve. I, Ruthell Rummage, CMA, am acting as scribe for Forest Gleason, MD.  Documentation: I have reviewed the above documentation for accuracy and completeness, and I agree with the above.  Forest Gleason, MD

## 2022-07-07 NOTE — Patient Instructions (Addendum)
Vitamin d 800 mg by mouth daily   Seborrheic Keratosis  What causes seborrheic keratoses? Seborrheic keratoses are harmless, common skin growths that first appear during adult life.  As time goes by, more growths appear.  Some people may develop a large number of them.  Seborrheic keratoses appear on both covered and uncovered body parts.  They are not caused by sunlight.  The tendency to develop seborrheic keratoses can be inherited.  They vary in color from skin-colored to gray, brown, or even black.  They can be either smooth or have a rough, warty surface.   Seborrheic keratoses are superficial and look as if they were stuck on the skin.  Under the microscope this type of keratosis looks like layers upon layers of skin.  That is why at times the top layer may seem to fall off, but the rest of the growth remains and re-grows.    Treatment Seborrheic keratoses do not need to be treated, but can easily be removed in the office.  Seborrheic keratoses often cause symptoms when they rub on clothing or jewelry.  Lesions can be in the way of shaving.  If they become inflamed, they can cause itching, soreness, or burning.  Removal of a seborrheic keratosis can be accomplished by freezing, burning, or surgery. If any spot bleeds, scabs, or grows rapidly, please return to have it checked, as these can be an indication of a skin cancer.      Melanoma ABCDEs  Melanoma is the most dangerous type of skin cancer, and is the leading cause of death from skin disease.  You are more likely to develop melanoma if you: Have light-colored skin, light-colored eyes, or red or blond hair Spend a lot of time in the sun Tan regularly, either outdoors or in a tanning bed Have had blistering sunburns, especially during childhood Have a close family member who has had a melanoma Have atypical moles or large birthmarks  Early detection of melanoma is key since treatment is typically straightforward and cure rates are  extremely high if we catch it early.   The first sign of melanoma is often a change in a mole or a new dark spot.  The ABCDE system is a way of remembering the signs of melanoma.  A for asymmetry:  The two halves do not match. B for border:  The edges of the growth are irregular. C for color:  A mixture of colors are present instead of an even brown color. D for diameter:  Melanomas are usually (but not always) greater than 50m - the size of a pencil eraser. E for evolution:  The spot keeps changing in size, shape, and color.  Please check your skin once per month between visits. You can use a small mirror in front and a large mirror behind you to keep an eye on the back side or your body.   If you see any new or changing lesions before your next follow-up, please call to schedule a visit.  Please continue daily skin protection including broad spectrum sunscreen SPF 30+ to sun-exposed areas, reapplying every 2 hours as needed when you're outdoors.   Staying in the shade or wearing long sleeves, sun glasses (UVA+UVB protection) and wide brim hats (4-inch brim around the entire circumference of the hat) are also recommended for sun protection.    Due to recent changes in healthcare laws, you may see results of your pathology and/or laboratory studies on MyChart before the doctors have had a  chance to review them. We understand that in some cases there may be results that are confusing or concerning to you. Please understand that not all results are received at the same time and often the doctors may need to interpret multiple results in order to provide you with the best plan of care or course of treatment. Therefore, we ask that you please give [place] 2 business days to thoroughly review all your results before contacting the office for clarification. Should we see a critical lab result, you will be contacted sooner.   If You Need Anything After Your Visit  If you have any questions or concerns for  your doctor, please call our main line at [phone] and press option 4 to reach your doctor's medical assistant. If no one answers, please leave a voicemail as directed and we will return your call as soon as possible. Messages left after 4 pm will be answered the following business day.   You may also send [place] a message via [place]. We typically respond to MyChart messages within 1-2 business days.  For prescription refills, please ask your pharmacy to contact our office. Our fax number is [phone].  If you have an urgent issue when the clinic is closed that cannot wait until the next business day, you can page your doctor at the number below.    Please note that while we do our best to be available for urgent issues outside of office hours, we are not available 24/7.   If you have an urgent issue and are unable to reach [place], you may choose to seek medical care at your doctor's office, retail clinic, urgent care center, or emergency room.  If you have a medical emergency, please immediately call 911 or go to the emergency department.  Pager Numbers  - Dr. Nehemiah Massed: [phone]  - Dr. Laurence Ferrari: [phone]  - Dr. Nicole Kindred: [phone]  In the event of inclement weather, please call our main line at [phone] for an update on the status of any delays or closures.  Dermatology Medication Tips: Please keep the boxes that topical medications come in in order to help keep track of the instructions about where and how to use these. Pharmacies typically print the medication instructions only on the boxes and not directly on the medication tubes.   If your medication is too expensive, please contact our office at [phone] option 4 or send [place] a message through [place].   We are unable to tell what your co-pay for medications will be in advance as this is different depending on your insurance coverage. However, we may be able to find a substitute medication at lower cost or fill out  paperwork to get insurance to cover a needed medication.   If a prior authorization is required to get your medication covered by your insurance company, please allow [place] 1-2 business days to complete this process.  Drug prices often vary depending on where the prescription is filled and some pharmacies may offer cheaper prices.  The website www.goodrx.com contains coupons for medications through different pharmacies. The prices here do not account for what the cost may be with help from insurance (it may be cheaper with your insurance), but the website can give you the price if you did not use any insurance.  - You can print the associated coupon and take it with your prescription to the pharmacy.  - You may also stop by our office during regular business hours and pick up a GoodRx coupon  card.  - If you need your prescription sent electronically to a different pharmacy, notify our office through [facility] or by phone at [phone] option 4.     Si Usted Necesita Algo Despus de Su Visita  Tambin puede enviarnos un mensaje a travs de Pharmacist, community. Por lo general respondemos a los mensajes de MyChart en el transcurso de 1 a 2 das hbiles.  Para renovar recetas, por favor pida a su farmacia que se ponga en contacto con nuestra oficina. Harland Dingwall de fax es [place] [phone].  Si tiene un asunto urgente cuando la clnica est cerrada y que no puede esperar hasta el siguiente da hbil, puede llamar/localizar a su doctor(a) al nmero que aparece a continuacin.   Por favor, tenga en cuenta que aunque hacemos todo lo posible para estar disponibles para asuntos urgentes fuera del horario de [place], no estamos disponibles las 24 horas del da, los 7 das de la [place].   Si tiene un problema urgente y no puede comunicarse con nosotros, puede optar por buscar atencin mdica  en el consultorio de su doctor(a), en una clnica privada, en un centro de atencin urgente o en una sala de  emergencias.  Si tiene Engineering geologist, por favor llame inmediatamente al 911 o vaya a la sala de emergencias.  Nmeros de bper  - Dr. Nehemiah Massed: [phone]  - Dra. Moye: [phone]  - Dra. Nicole Kindred: [phone]  En caso de inclemencias del [place], por favor llame a Johnsie Kindred principal al [phone] para una actualizacin sobre el [place] de cualquier retraso o cierre.  Consejos para la medicacin en dermatologa: Por favor, guarde las cajas en las que vienen los medicamentos de uso tpico para ayudarle a seguir las instrucciones sobre dnde y cmo usarlos. Las farmacias generalmente imprimen las instrucciones del medicamento slo en las cajas y no directamente en los tubos del [place].   Si su medicamento es muy caro, por favor, pngase en contacto con Zigmund Daniel llamando al [phone] y presione la opcin 4 o envenos un mensaje a travs de Pharmacist, community.   No podemos decirle cul ser su copago por los medicamentos por adelantado ya que esto es diferente dependiendo de la cobertura de su seguro. Sin embargo, es posible que podamos encontrar un medicamento sustituto a Electrical engineer un formulario para que el seguro cubra el medicamento que se considera necesario.   Si se requiere una autorizacin previa para que su compaa de seguros [place] su medicamento, por favor permtanos de 1 a 2 das hbiles para completar este proceso.  Los precios de los medicamentos varan con frecuencia dependiendo del Environmental consultant de dnde se surte la receta y alguna farmacias pueden ofrecer precios ms baratos.  El sitio web www.goodrx.com tiene cupones para medicamentos de Airline pilot. Los precios aqu no tienen en cuenta lo que podra costar con la ayuda del seguro (puede ser ms barato con su seguro), pero el sitio web puede darle el precio si no utiliz Research scientist (physical sciences).  - Puede imprimir el cupn correspondiente y llevarlo con su receta a la farmacia.  - Tambin puede pasar por  nuestra oficina durante el horario de atencin regular y Charity fundraiser una tarjeta de cupones de GoodRx.  - Si necesita que su receta se enve electrnicamente a una farmacia diferente, informe a nuestra oficina a travs de MyChart de [place] o por telfono llamando al [phone] y presione la opcin 4.

## 2022-07-08 ENCOUNTER — Encounter: Payer: Self-pay | Admitting: Dermatology

## 2022-07-30 ENCOUNTER — Telehealth: Payer: Self-pay

## 2022-07-30 NOTE — Progress Notes (Signed)
She should schedule office visit so we can discuiss all her issues and x ray report

## 2022-07-30 NOTE — Telephone Encounter (Signed)
1. 1 bowel movement per day is not abnormal - enquire what is the issue with the single bowel movement she has  2. She can come to the office for [place] to check the weight if she wishes to see if she has truly lost any

## 2022-07-30 NOTE — Telephone Encounter (Signed)
Patient called stating that she continues to have loose stools even though she took the Azythromycin prescribed to her to take for 3 days and then stop. That was on 06/23/2022. She stated that she continues to have a bowel movement at least once a day. She stated that she is continuing to lose weight. However, when she was here last, her weight was 137 lb on 05/17/2022 and her last visit with another provider on 07/23/2022 was 140 lbs. Please advise on what she could do. Thank you.

## 2022-08-02 NOTE — Telephone Encounter (Signed)
Called patient but had to leave her a voicemail letting her know that Dr. Vicente Males wanted for her to schedule an appointment to discuss results and all of her issues.

## 2022-08-03 ENCOUNTER — Ambulatory Visit (INDEPENDENT_AMBULATORY_CARE_PROVIDER_SITE_OTHER): Payer: Medicare HMO | Admitting: Gastroenterology

## 2022-08-03 ENCOUNTER — Encounter: Payer: Self-pay | Admitting: Gastroenterology

## 2022-08-03 VITALS — BP 128/77 | HR 73 | Temp 97.8°F | Wt 135.8 lb

## 2022-08-03 DIAGNOSIS — R197 Diarrhea, unspecified: Secondary | ICD-10-CM | POA: Diagnosis not present

## 2022-08-03 DIAGNOSIS — K56699 Other intestinal obstruction unspecified as to partial versus complete obstruction: Secondary | ICD-10-CM

## 2022-08-03 NOTE — Progress Notes (Signed)
[name] MD, MRCP(U.K) [address]  [place]  [place], [place] [address]  Main: [phone]  Fax: [phone]   Primary Care Physician: Kirk Ruths, MD  Primary Gastroenterologist:  Dr. [name]   Chief Complaint  Patient presents with   Diarrhea    HPI: Traci Quinn is a 88 y.o. female  Summary of history :   History referred and seen on 03/22/2022 for abdominal pain.She has previously been seen and evaluated at [facility] clinic gastroenterology back in March 2020 for incontinence of feces and dysphagia  Previously had tried cholestyramine, noted to have poor anal sphincter tone and was started on a high-fiber diet.  Colonoscopy was in 2006, EGD in 2017 showed abnormal motility medium amount of food in the stomach retained food in the duodenum.  Barium swallow in 2019 showed narrowing of the distal esophagus at the EG junction consistent with prior fundoplication this resulted in delay of standardized barium tablet.   She states that she has had difficulty swallowing for over 3 years.  Began after the fundoplication for hiatal hernia.  She states that sometimes solid food gets stuck and comes back off after a few hours.  She was told by Dr. Gustavo Lah to avoid an endoscopy. she has been having episodes of incontinence where she has a bowel movement without her knowledge and causes her to have issues socially.  She also has issues with urinary incontinence.  Does not recollect undergoing an episiotomy during childbirth.  She does recollect having prolonged labor.     04/14/2022: EGD: Large hiatal hernia noted evidence of Nissen fundoplication wrap appeared tight traversed and dilated to 15 mm abnormal motility in the lower third of the esophagus lower third of the esophagus was tortuous.  Since the upper endoscopy the dysphagia has resolved   Colonoscopy was performed on the same day diverticulosis of the colon was noted.  The scope could not be advanced past  the descending colon procedure was aborted due to extremely tortuous colon    Interval history 05/17/2022-08/03/2022     Could not get CT colonography approved by insurance hence proceeded with a barium enema patient struggled to retain the barium most of the barium emptied out of the rectum.  Incomplete study sigmoid diverticula were noted.  06/17/2019 3 GI PCR showed enterotoxigenic E. coli: Treated with azithromycin 500 mg daily for 3 days  No dysphagia , no diarrhea. Initial concern for weight loss which has stopped.      Current Outpatient Medications  Medication Sig Dispense Refill   amitriptyline (ELAVIL) 25 MG tablet      aspirin 81 MG tablet Take 81 mg by mouth daily.     budesonide-formoterol (SYMBICORT) 160-4.5 MCG/ACT inhaler Inhale 2 puffs into the lungs as needed.     fluticasone (VERAMYST) 27.5 MCG/SPRAY nasal spray Place 2 sprays into the nose as needed for rhinitis.     hydrochlorothiazide (HYDRODIURIL) 25 MG tablet Take 25 mg by mouth daily.     losartan-hydrochlorothiazide (HYZAAR) 100-12.5 MG per tablet      lovastatin (MEVACOR) 20 MG tablet      propranolol ER (INDERAL LA) 120 MG 24 hr capsule      No current facility-administered medications for this visit.    Allergies as of 08/03/2022 - Review Complete 08/03/2022  Allergen Reaction Noted   Atorvastatin  03/08/2014   Celecoxib  03/08/2014   Indomethacin Nausea And Vomiting 12/26/2013   Zetia [ezetimibe]  08/06/2016    ROS:  General: Negative for anorexia, weight loss, fever, chills, fatigue, weakness. ENT: Negative for hoarseness, difficulty swallowing , nasal congestion. CV: Negative for chest pain, angina, palpitations, dyspnea on exertion, peripheral edema.  Respiratory: Negative for dyspnea at rest, dyspnea on exertion, cough, sputum, wheezing.  GI: See history of present illness. GU:  Negative for dysuria, hematuria, urinary incontinence, urinary frequency, nocturnal urination.  Endo: Negative  for unusual weight change.    Physical Examination:   BP 128/77   Pulse 73   Temp 97.8 F (36.6 C) (Oral)   Wt 135 lb 12.8 oz (61.6 kg)   BMI 26.52 kg/m   General: Well-nourished, well-developed in no acute distress.  Eyes: No icterus. Conjunctivae pink. Neuro: Alert and oriented x 3.  Grossly intact. Skin: Warm and dry, no jaundice.   Psych: Alert and cooperative, normal mood and affect.   Imaging Studies: No results found.  Assessment and Plan:   Traci Quinn is a 88 y.o. y/o female with a history of a hiatal hernia that was repaired many years back with a Nissen fundoplication here to follow-up for dysphagia and fecal incontinence.  Likely the dysphagia secondary to a slipped Nissen fundoplication with a large hiatal hernia noted on endoscopy.  The slipped Nissen's may be causing extraluminal obstruction from compression from being too tight.  Large hiatal hernia noted empirically the GE junction was stretched.  Presently has no issues with dysphagia but if it were to recur will require referral to an upper GI surgeon. I could not go past ascending colon due to significant tortuosity and this may be contributing to obstructive diarrhea, couldn't obtain CT colonography earlier due to insurance/scheduling issues   Plan 1.  CT colonography to evaluate colon as there was significant tortuosity in the descending colon and the colonoscopy could not reach the cecum this may be contributing to obstructive diarrhea.Will re attempt to order the test as barium enema was inconclusive.    2.  I will also refer to Dr Hampton Abbot to determine if we should get a sigmoid colectomy now or wait .    Dr [name]  MD,MRCP [place]) Follow up in 2-4 months

## 2022-08-03 NOTE — Telephone Encounter (Signed)
Traci Quinn called back so I told her that it was better for her to come in and see Dr. Vicente Males to discuss her issues. Traci Quinn agreed and she will be coming in at 11:15 AM.

## 2022-08-03 NOTE — Addendum Note (Signed)
Addended by: Wayna Chalet on: 08/03/2022 11:43 AM   Modules accepted: Orders

## 2022-08-03 NOTE — Patient Instructions (Signed)
Please contact [facility] to schedule your CT Colonoscopy at [phone].

## 2022-08-04 ENCOUNTER — Encounter: Payer: Self-pay | Admitting: Surgery

## 2022-08-04 ENCOUNTER — Ambulatory Visit: Payer: Medicare HMO | Admitting: Surgery

## 2022-08-04 VITALS — BP 156/76 | HR 61 | Temp 97.7°F | Ht 60.0 in | Wt 134.4 lb

## 2022-08-04 DIAGNOSIS — R197 Diarrhea, unspecified: Secondary | ICD-10-CM | POA: Diagnosis not present

## 2022-08-04 DIAGNOSIS — R159 Full incontinence of feces: Secondary | ICD-10-CM

## 2022-08-04 DIAGNOSIS — Q438 Other specified congenital malformations of intestine: Secondary | ICD-10-CM | POA: Diagnosis not present

## 2022-08-04 NOTE — Progress Notes (Signed)
08/04/2022  Reason for Visit:  Tortuous colon with fecal incontinence  Requesting Provider:  [name], MD  History of Present Illness: Traci Quinn is a 89 y.o. female presenting for evaluation of tortuous sigmoid colon associated with fecal incontinence.  She has been followed by Dr. Vicente Males for dysphagia and fecal incontinence.  The patient has a history of Nissen fundoplication, cholecystectomy, appendectomy, and hysterectomy. She had an EGD on 04/14/22 which found an area of tightness within the wrap, which was dilated.  Her issues with fecal incontinence and diarrhea has been present for many years, and per prior notes, seems to have been ongoing for at least 10 years.  The patient feels that this year things have been worsening.  In the past, she had been doing well with taking imodium and making some dietary changes.  Now she reports that almost daily she has some degree of leakage and she cannot tell that she's having leakage.  She gets urgency and at that point she has to go right away or she may have an accident.  She will have diarrhea and if she eats a larger meal, she reports that food runs through her and she cannot hold her stool and has large diarrhea.  She reports the diarrhea is foul smelling and it floats in the toilet bowl.  She wears pullups to help with any incontinence episodes.  She also reports some intermittent lower abdominal discomfort, which she associates with taking imodium, so she has tried to stop that.  Denies any abdominal distention.  She has had E coli in the stool and received treatment for it about 6 weeks ago.  As part of her lower GI workup, a colonoscopy was attempted on 04/14/22, but this was aborted due to significant tortuosity of the sigmoid/descending colon.  She had a CT virtual colonoscopy in 2017 which did not detect any masses.  A CT colonography was ordered this year but did not get approved by insurance, and a barium contrast enema study was done  instead on 06/01/22.  On this study, the barium contrast is able to traverse the tortuous sigmoid colon proximally and extends to the proximal transverse colon, but then it started leaking around the tubing and the tubing would not stay in the rectum and the patient could not hold it.    Past Medical History: Past Medical History:  Diagnosis Date   AAA (abdominal aortic aneurysm) ([place])    Arthritis    Asthma    Bladder dysfunction    Cancer ([place])    left sided breast cancer   Diverticulosis    DVT (deep venous thrombosis) ([facility])    GERD (gastroesophageal reflux disease)    Hyperlipidemia    Hypertension    Migraines    Spastic colon      Past Surgical History: Past Surgical History:  Procedure Laterality Date   ABDOMINAL HYSTERECTOMY     APPENDECTOMY     BREAST EXCISIONAL BIOPSY Right 1970   neg surgical exc   BREAST LUMPECTOMY Left 2000   rad and no chemo   BREAST SURGERY     lumpectomy   CHOLECYSTECTOMY     COLONOSCOPY WITH PROPOFOL N/A 04/14/2022   Procedure: COLONOSCOPY WITH PROPOFOL;  Surgeon: [name], MD;  Location: [facility] ENDOSCOPY;  Service: Gastroenterology;  Laterality: N/A;   ESOPHAGOGASTRODUODENOSCOPY N/A 04/14/2022   Procedure: ESOPHAGOGASTRODUODENOSCOPY (EGD);  Surgeon: [name], MD;  [facility]COPY;  Service: Gastroenterology;  Laterality: N/A;   ESOPHAGOGASTRODUODENOSCOPY (EGD) WITH PROPOFOL N/A  08/06/2016   Procedure: ESOPHAGOGASTRODUODENOSCOPY (EGD) WITH PROPOFOL;  Surgeon: Lollie Sails, MD;  Location: [facility] ENDOSCOPY;  Service: Endoscopy;  Laterality: N/A;    Home Medications: Prior to Admission medications   Medication Sig Start Date End Date Taking? Authorizing Provider  amitriptyline (ELAVIL) 25 MG tablet  12/08/13  Yes [provider]  aspirin 81 MG tablet Take 81 mg by mouth daily.   Yes [provider]  budesonide-formoterol (SYMBICORT) 160-4.5 MCG/ACT inhaler Inhale 2 puffs into the lungs as needed.   Yes  [provider]  fluticasone (VERAMYST) 27.5 MCG/SPRAY nasal spray Place 2 sprays into the nose as needed for rhinitis.   Yes [provider]  hydrochlorothiazide (HYDRODIURIL) 25 MG tablet Take 25 mg by mouth daily. 04/14/21  Yes [provider]  losartan-hydrochlorothiazide Konrad Penta) 100-12.5 MG per tablet  10/17/13  Yes [provider]  lovastatin (MEVACOR) 20 MG tablet  11/12/13  Yes [provider]  propranolol ER (INDERAL LA) 120 MG 24 hr capsule  12/08/13  Yes [provider]    Allergies: Allergies  Allergen Reactions   Atorvastatin     Other reaction(s): Muscle Pain   Celecoxib     Other reaction(s): Other (See Comments) GI bleed   Indomethacin Nausea And Vomiting    Other reaction(s): Headache   Zetia [Ezetimibe]     Social History:  reports that she has never smoked. She has never used smokeless tobacco. She reports that she does not drink alcohol and does not use drugs.   Family History: Family History  Problem Relation Age of Onset   Cancer Mother    Cancer Father    COPD Daughter     Review of Systems: Review of Systems  Constitutional:  Negative for chills and fever.  HENT:  Negative for hearing loss.   Respiratory:  Negative for shortness of breath.   Cardiovascular:  Negative for chest pain.  Gastrointestinal:  Positive for abdominal pain (intermittent lower abdominal discomfort). Negative for nausea and vomiting.  Genitourinary:  Negative for dysuria.       Also has urinary incontinence  Musculoskeletal:  Negative for myalgias.  Skin:  Negative for rash.  Neurological:  Negative for dizziness.  Psychiatric/Behavioral:  Negative for depression.     Physical Exam BP (!) 156/76   Pulse 61   Temp 97.7 F (36.5 C) (Oral)   Ht 5' (1.524 m)   Wt 134 lb 6.4 oz (61 kg)   SpO2 97%   BMI 26.25 kg/m  CONSTITUTIONAL: No acute distress, well nourished. HEENT:  Normocephalic, atraumatic, extraocular motion  intact. NECK: Trachea is midline, and there is no jugular venous distension.  RESPIRATORY:  Lungs are clear, and breath sounds are equal bilaterally. Normal respiratory effort without pathologic use of accessory muscles. CARDIOVASCULAR: Heart is regular without murmurs, gallops, or rubs. GI: The abdomen is soft, non-distended, with some discomfort in the lower abdomen, but no significant tenderness, guarding, or peritonitis.  Prior incisions well healed.  RECTAL:  External exam does not reveal any enlarged hemorrhoids or masses.  Digital rectal exam reveals a very weak rectal tone.  When asked to squeeze her sphincter, there is no significant change in how much tension is applied.  No gross blood. MUSCULOSKELETAL:  Normal muscle strength and tone in all four extremities.  No peripheral edema or cyanosis. SKIN: Skin turgor is normal. There are no pathologic skin lesions.  NEUROLOGIC:  Motor and sensation is grossly normal.  Cranial nerves are grossly intact. PSYCH:  Alert and oriented to person, place and time. Affect is normal.  Laboratory Analysis: Labs from 05/19/22: Na 139, K 4, Cl 102, CO2 32.3, BUN 24, Cr 1.2, total bili 0.5, AST 19, ALT 20, Alk Phos 92, Albumin 4.  Imaging: Contrast enema study 06/01/22: FINDINGS: Incomplete double contrast barium enema due to inability of the patient retained enema tip, presumably due to decreased rectal tone. No obvious obstructive colonic mass. Scattered sigmoid diverticula.   IMPRESSION: 1. No apparent obstructive colonic mass on this limited double-contrast barium enema. 2. Sigmoid diverticulosis.  Assessment and Plan: This is a 88 y.o. female with fecal incontinence, diarrhea, and tortuous sigmoid colon.  --Discussed with the patient that there can be multifactorial reasons for her symptoms.  She's having diarrhea that she describes as foul smelling and floating.  Potentially she could be having a component of malabsorption.  Her anal sphincter  tone is diminished and she also has urinary incontinence, which could a result of pelvic floor dysfunction.  Although she does have a tortuous sigmoid colon, the barium contrast flowed through this area easily without any evidence of obstruction or narrowing.  She denies any abdominal distention, but does have lower abdominal discomfort, so the tortuosity could be contributing.  At this point, it's unclear if sigmoidectomy would be of any benefit.  She has further workup pending with Dr. Vicente Males which includes more stool studies and a CT colonography.  If that can be done this time, may be able to give [place] more information of any potential obstruction from her colon.  If that's negative and her stool studies are otherwise normal, may need a referral to colorectal surgery for pelvic floor evaluation.  For now, encouraged her to increase her fiber intake to help with the diarrhea. --She will follow up with me in a month to discuss the results of her studies and check on her symptoms.  I spent 40 minutes dedicated to the care of this patient on the date of this encounter to include pre-visit review of records, face-to-face time with the patient discussing diagnosis and management, and any post-visit coordination of care.   Melvyn Neth, [facility]

## 2022-08-04 NOTE — Patient Instructions (Signed)
If you have any concerns or questions, please feel free to call our office.   Diverticulosis  Diverticulosis is a condition that develops when small pouches (diverticula) form in the wall of the large intestine (colon). The colon is where water is absorbed and stool (feces) is formed. The pouches form when the inside layer of the colon pushes through weak spots in the outer layers of the colon. You may have a few pouches or many of them. The pouches usually do not cause problems unless they become inflamed or infected. When this happens, the condition is called diverticulitis. What are the causes? The cause of this condition is not known. What increases the risk? The following factors may make you more likely to develop this condition: Being older than age 18. Your risk for this condition increases with age. Diverticulosis is rare among people younger than age 49. By age 101, many people have it. Eating a low-fiber diet. Having frequent constipation. Being overweight. Not getting enough exercise. Smoking. Taking over-the-counter pain medicines, like aspirin and ibuprofen. Having a family history of diverticulosis. What are the signs or symptoms? In most people, there are no symptoms of this condition. If you do have symptoms, they may include: Bloating. Cramps in the abdomen. Constipation or diarrhea. Pain in the lower left side of the abdomen. How is this diagnosed? Because diverticulosis usually has no symptoms, it is most often diagnosed during an exam for other colon problems. The condition may be diagnosed by: Using a flexible scope to examine the colon (colonoscopy). Taking an X-ray of the colon after dye has been put into the colon (barium enema). Having a CT scan. How is this treated? You may not need treatment for this condition. Your health care provider may recommend treatment to prevent problems. You may need treatment if you have symptoms or if you previously had  diverticulitis. Treatment may include: Eating a high-fiber diet. Taking a fiber supplement. Taking a live bacteria supplement (probiotic). Taking medicine to relax your colon. Follow these instructions at home: Medicines Take over-the-counter and prescription medicines only as told by your health care provider. If told by your health care provider, take a fiber supplement or probiotic. Constipation prevention Your condition may cause constipation. To prevent or treat constipation, you may need to: Drink enough fluid to keep your urine pale yellow. Take over-the-counter or prescription medicines. Eat foods that are high in fiber, such as beans, whole grains, and fresh fruits and vegetables. Limit foods that are high in fat and processed sugars, such as fried or sweet foods.  General instructions Try not to strain when you have a bowel movement. Keep all follow-up visits as told by your health care provider. This is important. Contact a health care provider if you: Have pain in your abdomen. Have bloating. Have cramps. Have not had a bowel movement in 3 days. Get help right away if: Your pain gets worse. Your bloating becomes very bad. You have a fever or chills, and your symptoms suddenly get worse. You vomit. You have bowel movements that are bloody or black. You have bleeding from your rectum. Summary Diverticulosis is a condition that develops when small pouches (diverticula) form in the wall of the large intestine (colon). You may have a few pouches or many of them. This condition is most often diagnosed during an exam for other colon problems. Treatment may include increasing the fiber in your diet, taking supplements, or taking medicines. This information is not intended to replace advice given  to you by your health care provider. Make sure you discuss any questions you have with your health care provider. Document Revised: 03/08/2019 Document Reviewed: 03/08/2019 Elsevier  Patient Education  [place].

## 2022-08-13 LAB — GI PROFILE, STOOL, PCR

## 2022-08-13 LAB — CALPROTECTIN, FECAL: Calprotectin, Fecal: 42 ug/g (ref 0–120)

## 2022-08-13 LAB — C DIFFICILE, CYTOTOXIN B

## 2022-08-13 LAB — C DIFFICILE TOXINS A+B W/RFLX: C difficile Toxins A+B, EIA: NEGATIVE

## 2022-08-26 ENCOUNTER — Telehealth: Payer: Self-pay

## 2022-08-26 NOTE — Telephone Encounter (Signed)
Called patient back to let her know that she is to move forward with the CT Colonoscopy. I then let her know that I was faxing the order so she should hear back from them today or tomorrow. If she did not hear from them, to please give them a call back next week at [phone]. Patient understood and agreed and had no further questions.

## 2022-08-26 NOTE — Telephone Encounter (Signed)
Patient called wanting her stools results. I told her that they were all negative. Patient then stated that she continues to have diarrhea. She also stated that she had done basically everything that had been recommended by Dr. Vicente Males and nothing is taking care of her diarrhea. Patient also stated that she had seen Dr. Hampton Abbot and has a follow up appointment with him on 09/03/2022. Patient stated that on her last appointment with you, you had mentioned that you were going to order a CT colonography. Please advise.

## 2022-08-30 ENCOUNTER — Telehealth: Payer: Self-pay

## 2022-08-30 NOTE — Telephone Encounter (Signed)
Patient called stating that she was able to get her CT Colonoscopy until February. Therefore, she stated that she was going to reschedule her appointment with Dr. Hampton Abbot so he could have the results and from that, he will decide what would be best for her. Patient agreed and stated that she will reschedule her appointment with Dr. Hampton Abbot. Patient had no further questions.

## 2022-09-03 ENCOUNTER — Ambulatory Visit: Payer: Medicare HMO | Admitting: Surgery

## 2022-10-05 ENCOUNTER — Ambulatory Visit
Admission: RE | Admit: 2022-10-05 | Discharge: 2022-10-05 | Disposition: A | Discharge status: Home / Self Care | Payer: Self-pay | Source: Ambulatory Visit | Attending: Gastroenterology | Admitting: Gastroenterology

## 2022-10-05 DIAGNOSIS — K56699 Other intestinal obstruction unspecified as to partial versus complete obstruction: Secondary | ICD-10-CM

## 2022-10-13 ENCOUNTER — Other Ambulatory Visit: Payer: Self-pay

## 2022-10-13 ENCOUNTER — Encounter: Payer: Self-pay | Admitting: Surgery

## 2022-10-13 ENCOUNTER — Ambulatory Visit (INDEPENDENT_AMBULATORY_CARE_PROVIDER_SITE_OTHER): Payer: Medicare HMO | Admitting: Surgery

## 2022-10-13 VITALS — BP 144/79 | HR 59 | Temp 98.9°F | Ht 60.0 in | Wt 133.0 lb

## 2022-10-13 DIAGNOSIS — R159 Full incontinence of feces: Secondary | ICD-10-CM

## 2022-10-13 DIAGNOSIS — K6289 Other specified diseases of anus and rectum: Secondary | ICD-10-CM

## 2022-10-13 NOTE — Progress Notes (Unsigned)
10/13/2022  History of Present Illness: Traci Quinn is a 89 y.o. female presenting for follow up. She was last seen on 08/04/22 for diarrhea and fecal incontinence.  She had had a barium enema and partial colonoscopy which showed a very tortuous colon.  Also the barium enema was not fully complete as the contrast tip would slip out of the rectum and leak.  She had a CT virtual colonoscopy on 10/05/22 and this showed also a tortuous colon, but did not show any stricture or mass.  She has recovered from E coli gastroenteritis as well.  She reports that her bowel are less diarrhea and more soft.  She had recently an episode of diarrhea, but was only one day.  Denies any abdominal pain.  Reports still having some fecal incontinence, although also has improved.  She tried taking additional fiber after seeing me, but this did not help much her diarrhea.     Past Medical History: Past Medical History:  Diagnosis Date   AAA (abdominal aortic aneurysm) ([place])    Arthritis    Asthma    Bladder dysfunction    Cancer ([place])    left sided breast cancer   Diverticulosis    DVT (deep venous thrombosis) ([facility])    GERD (gastroesophageal reflux disease)    Hyperlipidemia    Hypertension    Migraines    Spastic colon      Past Surgical History: Past Surgical History:  Procedure Laterality Date   ABDOMINAL HYSTERECTOMY     APPENDECTOMY     BREAST EXCISIONAL BIOPSY Right 1970   neg surgical exc   BREAST LUMPECTOMY Left 2000   rad and no chemo   BREAST SURGERY     lumpectomy   CHOLECYSTECTOMY     COLONOSCOPY WITH PROPOFOL N/A 04/14/2022   Procedure: COLONOSCOPY WITH PROPOFOL;  Surgeon: [name], MD;  Location: [facility] ENDOSCOPY;  Service: Gastroenterology;  Laterality: N/A;   ESOPHAGOGASTRODUODENOSCOPY N/A 04/14/2022   Procedure: ESOPHAGOGASTRODUODENOSCOPY (EGD);  Surgeon: [name], MD;  Location: [facility] ENDOSCOPY;  Service: Gastroenterology;  Laterality: N/A;   ESOPHAGOGASTRODUODENOSCOPY (EGD)  WITH PROPOFOL N/A 08/06/2016   Procedure: ESOPHAGOGASTRODUODENOSCOPY (EGD) WITH PROPOFOL;  Surgeon: Lollie Sails, MD;  Location: [facility] ENDOSCOPY;  Service: Endoscopy;  Laterality: N/A;    Home Medications: Prior to Admission medications   Medication Sig Start Date End Date Taking? Authorizing Provider  amitriptyline (ELAVIL) 25 MG tablet  12/08/13  Yes [provider]  aspirin 81 MG tablet Take 81 mg by mouth daily.   Yes [provider]  budesonide-formoterol (SYMBICORT) 160-4.5 MCG/ACT inhaler Inhale 2 puffs into the lungs as needed.   Yes [provider]  fluticasone (VERAMYST) 27.5 MCG/SPRAY nasal spray Place 2 sprays into the nose as needed for rhinitis.   Yes [provider]  hydrochlorothiazide (HYDRODIURIL) 25 MG tablet Take 25 mg by mouth daily. 04/14/21  Yes [provider]  losartan-hydrochlorothiazide Konrad Penta) 100-12.5 MG per tablet  10/17/13  Yes [provider]  lovastatin (MEVACOR) 20 MG tablet  11/12/13  Yes [provider]  propranolol ER (INDERAL LA) 120 MG 24 hr capsule  12/08/13  Yes [provider]    Allergies: Allergies  Allergen Reactions   Atorvastatin     Other reaction(s): Muscle Pain   Celecoxib     Other reaction(s): Other (See Comments) GI bleed   Indomethacin Nausea And Vomiting    Other reaction(s): Headache   Zetia [Ezetimibe]     Review of Systems: Review of  Systems  Constitutional:  Negative for chills and fever.  Respiratory:  Negative for shortness of breath.   Cardiovascular:  Negative for chest pain.  Gastrointestinal:  Positive for diarrhea. Negative for abdominal pain, nausea and vomiting.    Physical Exam BP (!) 144/79   Pulse (!) 59   Temp 98.9 F (37.2 C) (Oral)   Ht 5' (1.524 m)   Wt 133 lb (60.3 kg)   SpO2 99%   BMI 25.97 kg/m  CONSTITUTIONAL: No acute distress HEENT:  Normocephalic, atraumatic, extraocular motion intact. RESPIRATORY:  Normal  respiratory effort without pathologic use of accessory muscles. CARDIOVASCULAR: Regular rhythm and rate. GI: The abdomen is soft, non-distended, non-tender.  Rectal exam deferred today, but last visit she has diminished rectal tone.  NEUROLOGIC:  Motor and sensation is grossly normal.  Cranial nerves are grossly intact. PSYCH:  Alert and oriented to person, place and time. Affect is normal.  Labs/Imaging: CT virtual colonoscopy on 10/05/22: IMPRESSION: 1. Tortuous under distended sigmoid colon with scattered retained fluid. Otherwise, no definite polyp, mass or stricture. 2. 10 mm peripherally calcified splenic artery aneurysm. 3.  Aortic atherosclerosis (ICD10-I70.0)  Assessment and Plan: This is a 89 y.o. female with diarrhea and fecal incontinence.  --Discussed with the patient the findings on her CT colonoscopy.  Overall again shows a tortuous colon, but no mass or stenosis that would need resection.  Discussed with her that typically patients with redundant colons will have constipation or distention rather than diarrhea or incontinence.  She also has decreased rectal tone and also has issues with urinary leakage.  Discussed with her that again this could be related to pelvic floor dysfunction.  Discussed that now that her diarrhea has improved, likely now that her E coli has resolved, she should try fiber supplement again to see if this can help form her stool better.  This may in turn improve her incontinence.  Discussed with her that if this does not help, then she should contact us and we can set up a referral for colorectal surgery for evaluation of pelvic floor issues.  She's in agreement with this plan. --Follow up as needed.  She will contact us if the fiber supplementation does not work over the next week.  I spent 20 minutes dedicated to the care of this patient on the date of this encounter to include pre-visit review of records, face-to-face time with the patient discussing  diagnosis and management, and any post-visit coordination of care.   Melvyn Neth, [facility]

## 2022-10-13 NOTE — Patient Instructions (Signed)
  Please call the office if you would like a referral to Referral to Colorectal surgeon for fecal incontinence.   Please be sure to drink plenty of water daily.     Advised to pursue a goal of 25 to 30 g of fiber daily.  Made aware that the majority of this may be through natural sources, but advised to be aware of actual consumption and to ensure minimal consumption by daily supplementation.  Various forms of supplements discussed.  Recommended Psyllium husk, that mixes well with applesauce, or the powder which goes down well shaken with chocolate milk.  Strongly advised to consume more fluids to ensure adequate hydration, instructed to watch color of urine to determine adequacy of hydration.  Clarity is pursued in urine output, and bowel activity that correlates to significant meal intake.   We need to avoid deferring having bowel movements, advised to take the time at the first sign of sensation, typically following meals, and in the morning.   Subsequent utilization of MiraLAX may be needed ensure at least daily movement, ideally twice daily bowel movements.  If multiple doses of MiraLAX are necessary utilize them. Never skip a day...  To be regular, we must do the above EVERY day.

## 2022-10-14 ENCOUNTER — Encounter: Payer: Self-pay | Admitting: Surgery

## 2022-11-11 ENCOUNTER — Ambulatory Visit: Payer: Medicare HMO | Admitting: Cardiovascular Disease

## 2022-11-23 NOTE — Progress Notes (Signed)
No Clear evidence of any mass or obstruction does show tortuosity of the sigmoid colon

## 2022-11-24 ENCOUNTER — Telehealth: Payer: Self-pay

## 2022-11-24 NOTE — Telephone Encounter (Signed)
Called patient to let her know what her CT virtual colonoscopy result and she appreciated the call as she did not understood it when she was told after her colonoscopy. Patient stated that she was feeling better and that she currently didn't have any questions.

## 2022-11-24 NOTE — Telephone Encounter (Signed)
-----   Message from [name], MD sent at 11/23/2022  1:08 PM EDT ----- No Clear evidence of any mass or obstruction does show tortuosity of the sigmoid colon

## 2022-12-17 ENCOUNTER — Ambulatory Visit: Payer: Medicare HMO | Admitting: Cardiovascular Disease

## 2022-12-21 ENCOUNTER — Encounter: Payer: Self-pay | Admitting: Cardiovascular Disease

## 2022-12-21 ENCOUNTER — Ambulatory Visit: Payer: Medicare HMO | Attending: Cardiovascular Disease | Admitting: Cardiovascular Disease

## 2022-12-21 VITALS — BP 130/60 | HR 63 | Ht 60.0 in | Wt 134.2 lb

## 2022-12-21 DIAGNOSIS — I7300 Raynaud's syndrome without gangrene: Secondary | ICD-10-CM | POA: Diagnosis not present

## 2022-12-21 DIAGNOSIS — E785 Hyperlipidemia, unspecified: Secondary | ICD-10-CM

## 2022-12-21 DIAGNOSIS — I10 Essential (primary) hypertension: Secondary | ICD-10-CM | POA: Diagnosis not present

## 2022-12-21 NOTE — Patient Instructions (Signed)
Medication Instructions:  No changes *If you need a refill on your cardiac medications before your next appointment, please call your pharmacy*   Lab Work: None ordered If you have labs (blood work) drawn today and your tests are completely normal, you will receive your results only by: MyChart Message (if you have MyChart) OR A paper copy in the mail If you have any lab test that is abnormal or we need to change your treatment, we will call you to review the results.   Testing/Procedures: None ordered   Follow-Up: At [facility], you and your health needs are our priority.  As part of our continuing mission to provide you with exceptional heart care, we have created designated Provider Care Teams.  These Care Teams include your primary Cardiologist (physician) and Advanced Practice Providers (APPs -  Physician Assistants and Nurse Practitioners) who all work together to provide you with the care you need, when you need it.  We recommend signing up for the patient portal called "MyChart".  Sign up information is provided on this After Visit Summary.  MyChart is used to connect with patients for Virtual Visits (Telemedicine).  Patients are able to view lab/test results, encounter notes, upcoming appointments, etc.  Non-urgent messages can be sent to your provider as well.   To learn more about what you can do with MyChart, go to [email or web address].    Your next appointment:   12 month(s)  Provider:   You may see Dr. Arida or one of the following Advanced Practice Providers on your designated Care Team:   Christopher Berge, NP Ryan Dunn, PA-C Cadence Furth, PA-C Sheri Hammock, NP    

## 2022-12-21 NOTE — Progress Notes (Signed)
Cardiology Office Note   Date:  12/21/2022   ID:  Traci Quinn, DOB 1934/01/06, MRN [id number]  PCP:  Lauro Regulus, MD  Cardiologist:   Lorine Bears, MD   Chief Complaint  Patient presents with   Follow-up    6 month f/u no complaints today. Meds reviewed verbally with pt.      History of Present Illness: Traci Quinn is a 89 y.o. female who is here today for follow-up visit.  She was previously followed by Dr. Lady Gary.   She had previous history of DVT, essential hypertension, hyperlipidemia and Raynaud's phenomena . DVT she had was remote and she was briefly anticoagulated.  No recurrent events since then.  She was briefly on amlodipine for blood pressure and Raynaud's but she is no longer on the medication.  She is a lifelong non-smoker and does not drink alcohol.  There is no family history of coronary artery disease.  She is the sole caregiver of her great grandson who is 26 years old.  She has been doing well with no chest pain, shortness of breath or palpitations.  She fell 4 weeks ago but fortunately, no significant physical injuries as there are some laceration on her right hand.    Past Medical History:  Diagnosis Date   AAA (abdominal aortic aneurysm) (HCC)    Arthritis    Asthma    Bladder dysfunction    Cancer (HCC)    left sided breast cancer   Diverticulosis    DVT (deep venous thrombosis) (HCC)    GERD (gastroesophageal reflux disease)    Hyperlipidemia    Hypertension    Migraines    Spastic colon     Past Surgical History:  Procedure Laterality Date   ABDOMINAL HYSTERECTOMY     APPENDECTOMY     BREAST EXCISIONAL BIOPSY Right 1970   neg surgical exc   BREAST LUMPECTOMY Left 2000   rad and no chemo   BREAST SURGERY     lumpectomy   CHOLECYSTECTOMY     COLONOSCOPY WITH PROPOFOL N/A 04/14/2022   Procedure: COLONOSCOPY WITH PROPOFOL;  Surgeon: Wyline Mood, MD;  Location: [facility] ENDOSCOPY;  Service: Gastroenterology;  Laterality:  N/A;   ESOPHAGOGASTRODUODENOSCOPY N/A 04/14/2022   Procedure: ESOPHAGOGASTRODUODENOSCOPY (EGD);  Surgeon: Wyline Mood, MD;  Location: [facility] ENDOSCOPY;  Service: Gastroenterology;  Laterality: N/A;   ESOPHAGOGASTRODUODENOSCOPY (EGD) WITH PROPOFOL N/A 08/06/2016   Procedure: ESOPHAGOGASTRODUODENOSCOPY (EGD) WITH PROPOFOL;  Surgeon: Christena Deem, MD;  Location: [facility] ENDOSCOPY;  Service: Endoscopy;  Laterality: N/A;     Current Outpatient Medications  Medication Sig Dispense Refill   amitriptyline (ELAVIL) 25 MG tablet Take 25 mg by mouth at bedtime.     aspirin 81 MG tablet Take 81 mg by mouth daily.     budesonide-formoterol (SYMBICORT) 160-4.5 MCG/ACT inhaler Inhale 2 puffs into the lungs as needed.     fluticasone (VERAMYST) 27.5 MCG/SPRAY nasal spray Place 2 sprays into the nose as needed for rhinitis.     hydrochlorothiazide (HYDRODIURIL) 25 MG tablet Take 25 mg by mouth daily.     losartan-hydrochlorothiazide (HYZAAR) 100-12.5 MG per tablet Take 1 tablet by mouth daily.     lovastatin (MEVACOR) 20 MG tablet Take 20 mg by mouth at bedtime.     propranolol ER (INDERAL LA) 120 MG 24 hr capsule Take 120 mg by mouth daily.     No current facility-administered medications for this visit.    Allergies:   Atorvastatin, Celecoxib, Indomethacin, and Zetia [  ezetimibe]    Social History:  The patient  reports that she has never smoked. She has never used smokeless tobacco. She reports that she does not drink alcohol and does not use drugs.   Family History:  The patient's family history includes COPD in her daughter; Cancer in her father and mother.    ROS:  Please see the history of present illness.   Otherwise, review of systems are positive for none.   All other systems are reviewed and negative.    PHYSICAL EXAM: VS:  BP 130/60 (BP Location: Left Arm, Patient Position: Sitting, Cuff Size: Normal)   Pulse 63   Ht 5' (1.524 m)   Wt 134 lb 4 oz (60.9 kg)   SpO2 98%   BMI 26.22 kg/m   , BMI Body mass index is 26.22 kg/m. GEN: Well nourished, well developed, in no acute distress  HEENT: normal  Neck: no JVD, carotid bruits, or masses Cardiac: RRR; no murmurs, rubs, or gallops,no edema  Respiratory:  clear to auscultation bilaterally, normal work of breathing GI: soft, nontender, nondistended, + BS MS: no deformity or atrophy  Skin: warm and dry, no rash Neuro:  Strength and sensation are intact Psych: euthymic mood, full affect   EKG:  EKG is ordered today. The ekg ordered today demonstrates normal sinus rhythm with poor R wave progression in the anterior leads.   Recent Labs: No results found for requested labs within last 365 days.    Lipid Panel No results found for: "CHOL", "TRIG", "HDL", "CHOLHDL", "VLDL", "LDLCALC", "LDLDIRECT"    Wt Readings from Last 3 Encounters:  12/21/22 134 lb 4 oz (60.9 kg)  10/13/22 133 lb (60.3 kg)  08/04/22 134 lb 6.4 oz (61 kg)          05/11/2022    1:54 PM  PAD Screen  Previous PAD dx? No  Previous surgical procedure? No  Pain with walking? No  Feet/toe relief with dangling? No  Painful, non-healing ulcers? No  Extremities discolored? No      ASSESSMENT AND PLAN:  1.  Essential hypertension: Blood pressure is controlled on current medications.  2.  Raynaud's phenomena: She used to be on amlodipine but is no longer taking it after the prescription ran out.  She reports no change in symptoms.  Continue to monitor for now and resume if she has recurrent symptoms.  3.  Hyperlipidemia: Currently on lovastatin.  I reviewed her labs done earlier this month which showed an LDL of 61.  4.  Hypertensive kidney disease with stage III chronic kidney disease.  Most recent creatinine was 1.2.     Disposition:   FU with me in 12 months  Signed,  Lorine Bears, MD  12/21/2022 1:34 PM    [facility]

## 2023-06-13 ENCOUNTER — Other Ambulatory Visit (INDEPENDENT_AMBULATORY_CARE_PROVIDER_SITE_OTHER): Payer: Self-pay | Admitting: Vascular Surgery

## 2023-06-13 DIAGNOSIS — I728 Aneurysm of other specified arteries: Secondary | ICD-10-CM

## 2023-06-14 ENCOUNTER — Ambulatory Visit (INDEPENDENT_AMBULATORY_CARE_PROVIDER_SITE_OTHER): Payer: Medicare HMO | Admitting: Vascular Surgery

## 2023-06-14 ENCOUNTER — Encounter (INDEPENDENT_AMBULATORY_CARE_PROVIDER_SITE_OTHER): Payer: Self-pay | Admitting: Vascular Surgery

## 2023-06-14 ENCOUNTER — Ambulatory Visit (INDEPENDENT_AMBULATORY_CARE_PROVIDER_SITE_OTHER): Payer: Medicare HMO

## 2023-06-14 VITALS — BP 160/81 | HR 64 | Resp 18 | Ht 60.0 in | Wt 129.2 lb

## 2023-06-14 DIAGNOSIS — I129 Hypertensive chronic kidney disease with stage 1 through stage 4 chronic kidney disease, or unspecified chronic kidney disease: Secondary | ICD-10-CM

## 2023-06-14 DIAGNOSIS — I728 Aneurysm of other specified arteries: Secondary | ICD-10-CM | POA: Diagnosis not present

## 2023-06-14 DIAGNOSIS — N1832 Chronic kidney disease, stage 3b: Secondary | ICD-10-CM

## 2023-06-14 DIAGNOSIS — I10 Essential (primary) hypertension: Secondary | ICD-10-CM

## 2023-06-14 NOTE — Progress Notes (Signed)
MRN : [id number]  Traci Quinn is a 89 y.o. (Jun 02, 1934) female who presents with chief complaint of  Chief Complaint  Patient presents with   Follow-up    2 year mesenteric  .  History of Present Illness: Patient returns today in follow up of her splenic artery aneurysm.  She is doing well today.  No specific complaints.  No major issues or problems since her last visit.  Duplex today shows no elevated velocities in the mesenteric vessels with what appears to be a stable 9 mm distal splenic artery aneurysm.  Current Outpatient Medications  Medication Sig Dispense Refill   amitriptyline (ELAVIL) 25 MG tablet Take 25 mg by mouth at bedtime.     aspirin 81 MG tablet Take 81 mg by mouth daily.     budesonide-formoterol (SYMBICORT) 160-4.5 MCG/ACT inhaler Inhale 2 puffs into the lungs as needed.     fluticasone (VERAMYST) 27.5 MCG/SPRAY nasal spray Place 2 sprays into the nose as needed for rhinitis.     hydrochlorothiazide (HYDRODIURIL) 25 MG tablet Take 25 mg by mouth daily.     losartan-hydrochlorothiazide (HYZAAR) 100-12.5 MG per tablet Take 1 tablet by mouth daily.     lovastatin (MEVACOR) 20 MG tablet Take 20 mg by mouth at bedtime.     propranolol ER (INDERAL LA) 120 MG 24 hr capsule Take 120 mg by mouth daily.     No current facility-administered medications for this visit.    Past Medical History:  Diagnosis Date   AAA (abdominal aortic aneurysm) (HCC)    Arthritis    Asthma    Bladder dysfunction    Cancer (HCC)    left sided breast cancer   Diverticulosis    DVT (deep venous thrombosis) (HCC)    GERD (gastroesophageal reflux disease)    Hyperlipidemia    Hypertension    Migraines    Spastic colon     Past Surgical History:  Procedure Laterality Date   ABDOMINAL HYSTERECTOMY     APPENDECTOMY     BREAST EXCISIONAL BIOPSY Right 1970   neg surgical exc   BREAST LUMPECTOMY Left 2000   rad and no chemo   BREAST SURGERY     lumpectomy   CHOLECYSTECTOMY      COLONOSCOPY WITH PROPOFOL N/A 04/14/2022   Procedure: COLONOSCOPY WITH PROPOFOL;  Surgeon: Wyline Mood, MD;  Location: [facility] ENDOSCOPY;  Service: Gastroenterology;  Laterality: N/A;   ESOPHAGOGASTRODUODENOSCOPY N/A 04/14/2022   Procedure: ESOPHAGOGASTRODUODENOSCOPY (EGD);  Surgeon: Wyline Mood, MD;  Location: [facility] ENDOSCOPY;  Service: Gastroenterology;  Laterality: N/A;   ESOPHAGOGASTRODUODENOSCOPY (EGD) WITH PROPOFOL N/A 08/06/2016   Procedure: ESOPHAGOGASTRODUODENOSCOPY (EGD) WITH PROPOFOL;  Surgeon: Christena Deem, MD;  Location: [facility] ENDOSCOPY;  Service: Endoscopy;  Laterality: N/A;     Social History   Tobacco Use   Smoking status: Never   Smokeless tobacco: Never  Substance Use Topics   Alcohol use: No   Drug use: Never       Family History  Problem Relation Age of Onset   Cancer Mother    Cancer Father    COPD Daughter      Allergies  Allergen Reactions   Atorvastatin     Other reaction(s): Muscle Pain   Celecoxib     Other reaction(s): Other (See Comments) GI bleed   Indomethacin Nausea And Vomiting    Other reaction(s): Headache   Zetia [Ezetimibe]      REVIEW OF SYSTEMS (Negative unless checked)  Constitutional: [] Weight loss  [] Fever  []   Chills Cardiac: [] Chest pain   [] Chest pressure   [] Palpitations   [] Shortness of breath when laying flat   [] Shortness of breath at rest   [] Shortness of breath with exertion. Vascular:  [] Pain in legs with walking   [] Pain in legs at rest   [] Pain in legs when laying flat   [] Claudication   [] Pain in feet when walking  [] Pain in feet at rest  [] Pain in feet when laying flat   [] History of DVT   [] Phlebitis   [] Swelling in legs   [] Varicose veins   [] Non-healing ulcers Pulmonary:   [] Uses home oxygen   [] Productive cough   [] Hemoptysis   [] Wheeze  [] COPD   [] Asthma Neurologic:  [] Dizziness  [] Blackouts   [] Seizures   [] History of stroke   [] History of TIA  [] Aphasia   [] Temporary blindness   [] Dysphagia   [] Weakness or  numbness in arms   [] Weakness or numbness in legs Musculoskeletal:  [] Arthritis   [] Joint swelling   [] Joint pain   [] Low back pain Hematologic:  [] Easy bruising  [] Easy bleeding   [] Hypercoagulable state   [] Anemic   Gastrointestinal:  [] Blood in stool   [] Vomiting blood  [] Gastroesophageal reflux/heartburn   [] Abdominal pain Genitourinary:  [x] Chronic kidney disease   [] Difficult urination  [] Frequent urination  [] Burning with urination   [] Hematuria Skin:  [] Rashes   [] Ulcers   [] Wounds Psychological:  [] History of anxiety   []  History of major depression.  Physical Examination  BP (!) 160/81 (BP Location: Left Arm)   Pulse 64   Resp 18   Ht 5' (1.524 m)   Wt 129 lb 3.2 oz (58.6 kg)   BMI 25.23 kg/m  Gen:  WD/WN, NAD. Appears younger than stated age. Head: Wann/AT, No temporalis wasting. Ear/Nose/Throat: Hearing grossly intact, nares w/o erythema or drainage Eyes: Conjunctiva clear. Sclera non-icteric Neck: Supple.  Trachea midline Pulmonary:  Good air movement, no use of accessory muscles.  Cardiac: RRR, no JVD Vascular:  Vessel Right Left  Radial Palpable Palpable           Musculoskeletal: M/S 5/5 throughout.  No deformity or atrophy. No edema. Neurologic: Sensation grossly intact in extremities.  Symmetrical.  Speech is fluent.  Psychiatric: Judgment intact, Mood & affect appropriate for pt's clinical situation. Dermatologic: No rashes or ulcers noted.  No cellulitis or open wounds.      Labs No results found for this or any previous visit (from the past 2160 hour(s)).  Radiology No results found.  Assessment/Plan Essential hypertension, benign blood pressure control important in reducing the progression of atherosclerotic disease and aneurysmal degeneration. On appropriate oral medications.   Stage IIIb chronic kidney disease Avoid contrast and follow this with duplex every other year.  Splenic artery aneurysm (HCC) Duplex today shows no elevated velocities  in the mesenteric vessels with what appears to be a stable 9 mm distal splenic artery aneurysm.  No role for intervention.  This is very unlikely to cause problems but we will continue to follow this on every other year basis at this point.    Festus Barren, MD  06/14/2023 10:44 AM    This note was created with Dragon medical transcription system.  Any errors from dictation are purely unintentional

## 2023-06-14 NOTE — Assessment & Plan Note (Signed)
Duplex today shows no elevated velocities in the mesenteric vessels with what appears to be a stable 9 mm distal splenic artery aneurysm.  No role for intervention.  This is very unlikely to cause problems but we will continue to follow this on every other year basis at this point.

## 2024-03-05 ENCOUNTER — Ambulatory Visit: Attending: Cardiology | Admitting: Cardiology

## 2024-03-05 ENCOUNTER — Encounter: Payer: Self-pay | Admitting: Cardiology

## 2024-03-05 VITALS — BP 118/58 | HR 60 | Ht 60.0 in | Wt 130.4 lb

## 2024-03-05 DIAGNOSIS — I728 Aneurysm of other specified arteries: Secondary | ICD-10-CM

## 2024-03-05 DIAGNOSIS — N1831 Chronic kidney disease, stage 3a: Secondary | ICD-10-CM

## 2024-03-05 DIAGNOSIS — I10 Essential (primary) hypertension: Secondary | ICD-10-CM | POA: Diagnosis not present

## 2024-03-05 DIAGNOSIS — E785 Hyperlipidemia, unspecified: Secondary | ICD-10-CM | POA: Diagnosis not present

## 2024-03-05 DIAGNOSIS — I7300 Raynaud's syndrome without gangrene: Secondary | ICD-10-CM

## 2024-03-05 NOTE — Progress Notes (Signed)
 Cardiology Office Note   Date:  03/05/2024  ID:  KARENA KINKER, DOB April 30, 1934, MRN [id number] PCP: Lenon Layman ORN, MD   [facility] Providers Cardiologist:  Deatrice Cage, MD Cardiology APP:  Gerard Frederick, NP     History of Present Illness Traci Quinn is a 89 y.o. female with a past medical history of DVT, essential hypertension, hyperlipidemia, Raynaud's phenomenon, who is here today for follow-up.  Previously she had been followed by Dr. Bosie.  She had a history of a DVT that was removed and she was previously anticoagulated.  She has had no recurrent events since that time.  She was briefly on amlodipine for blood pressure Raynaud's but no longer was taking the medication.  She is a lifelong non-smoker and does not drink alcohol.  There was no history of coronary artery disease.  She was last seen in clinic 12/21/22 by Dr.Arida.  At that time she was doing well from the cardiac perspective without chest pain, shortness of breath, or palpitations.  Unfortunately she had fell about 4 weeks prior with no significant physical injuries but she did have a laceration to her hand.  There were no medication changes were made and no further testing that was ordered at that time.  She returns to clinic today stating that she has been doing well from a cardiac perspective.  She denies any chest pain, shortness of breath, occasional peripheral edema.  Continues to be the primary caregiver for her grandson who is now 47 years old.  She states she recently had had a summer cold upper respiratory infection that she was treated in the outpatient setting with antibiotics.  States that she has been compliant with her current medication regimen without any undue side effects.  She denies any hospitalizations or visits to the emergency department.  ROS: 10 point review of system has been reviewed and considered negative with exception was been listed in HPI  Studies Reviewed EKG  Interpretation Date/Time:  Monday March 05 2024 10:06:21 EDT Ventricular Rate:  60 PR Interval:  208 QRS Duration:  108 QT Interval:  438 QTC Calculation: 438 R Axis:   25  Text Interpretation: Normal sinus rhythm First degree A-V block Anterior infarct (cited on or before 03-Sep-2008) When compared with ECG of 03-Sep-2008 15:24, T wave inversion now evident in Inferior leads Confirmed by Gerard Frederick ([id number]) on 03/05/2024 10:08:37 AM    Risk Assessment/Calculations           Physical Exam VS:  BP (!) 118/58 (BP Location: Left Arm, Patient Position: Sitting)   Pulse 60   Ht 5' (1.524 m)   Wt 130 lb 6.4 oz (59.1 kg)   SpO2 95%   BMI 25.47 kg/m        Wt Readings from Last 3 Encounters:  03/05/24 130 lb 6.4 oz (59.1 kg)  06/14/23 129 lb 3.2 oz (58.6 kg)  12/21/22 134 lb 4 oz (60.9 kg)    GEN: Well nourished, well developed in no acute distress NECK: No JVD; No carotid bruits CARDIAC: RRR, no murmurs, rubs, gallops RESPIRATORY:  Clear to auscultation without rales, wheezing or rhonchi  ABDOMEN: Soft, non-tender, non-distended EXTREMITIES:  No edema; No deformity   ASSESSMENT AND PLAN Essential hypertension blood pressure today 119/58.  Blood pressures remain stable.  She has been continued on losartan/HCTZ 100-12.5 mg daily, HCTZ and propranolol.  She has been encouraged to continue to monitor her blood pressures 1 to 2 hours postmedication administration as well.  EKG today reveals sinus rhythm with first-degree AV block at a rate of 60 with an old anterior infarct.  No acute changes noted.  Mixed hyperlipidemia with last LDL 51.  Cholesterol remains at goal.  She is continued on lovastatin 20 mg daily.  Ongoing management per PCP.  Chronic kidney disease with last serum creatinine of 1.0.  Has remained stable.  Continue to remain hydrated and avoid NSAIDs.  Raynaud's phenomena which she reports no change in symptoms.  Previously was on amlodipine but has not restarted this  medication after running out last year.  Continue to monitor and consider resuming amlodipine if she has recurrent symptoms.  Splenic artery aneurysm which she continues to be followed by vascular.  Continue on aspirin and statin therapy.       Dispo: Patient return to clinic to see MD/APP in 11 to 12 months or sooner if needed.  Signed, Ileene Allie, NP

## 2024-03-05 NOTE — Patient Instructions (Signed)
 Medication Instructions:  Your physician recommends that you continue on your current medications as directed. Please refer to the Current Medication list given to you today.   *If you need a refill on your cardiac medications before your next appointment, please call your pharmacy*  Lab Work: No labs ordered today  If you have labs (blood work) drawn today and your tests are completely normal, you will receive your results only by: MyChart Message (if you have MyChart) OR A paper copy in the mail If you have any lab test that is abnormal or we need to change your treatment, we will call you to review the results.  Testing/Procedures: No test ordered today   Follow-Up: At [facility], you and your health needs are our priority.  As part of our continuing mission to provide you with exceptional heart care, our providers are all part of one team.  This team includes your primary Cardiologist (physician) and Advanced Practice Providers or APPs (Physician Assistants and Nurse Practitioners) who all work together to provide you with the care you need, when you need it.  Your next appointment:   12 month(s)  Provider:   Deatrice Cage, MD or Tylene Lunch, NP    We recommend signing up for the patient portal called MyChart.  Sign up information is provided on this After Visit Summary.  MyChart is used to connect with patients for Virtual Visits (Telemedicine).  Patients are able to view lab/test results, encounter notes, upcoming appointments, etc.  Non-urgent messages can be sent to your provider as well.   To learn more about what you can do with MyChart, go to [email or web address].

## 2024-03-16 ENCOUNTER — Emergency Department

## 2024-03-16 ENCOUNTER — Telehealth: Payer: Self-pay | Admitting: Emergency Medicine

## 2024-03-16 ENCOUNTER — Encounter: Payer: Self-pay | Admitting: Internal Medicine

## 2024-03-16 ENCOUNTER — Telehealth: Payer: Self-pay | Admitting: Cardiovascular Disease

## 2024-03-16 ENCOUNTER — Inpatient Hospital Stay
Admission: EM | Admit: 2024-03-16 | Discharge: 2024-03-18 | DRG: 309 | Disposition: A | Discharge status: Home Health | Source: Ambulatory Visit | Attending: Internal Medicine | Admitting: Internal Medicine

## 2024-03-16 ENCOUNTER — Other Ambulatory Visit: Payer: Self-pay

## 2024-03-16 DIAGNOSIS — Z923 Personal history of irradiation: Secondary | ICD-10-CM

## 2024-03-16 DIAGNOSIS — D510 Vitamin B12 deficiency anemia due to intrinsic factor deficiency: Secondary | ICD-10-CM | POA: Diagnosis not present

## 2024-03-16 DIAGNOSIS — R350 Frequency of micturition: Secondary | ICD-10-CM | POA: Diagnosis present

## 2024-03-16 DIAGNOSIS — T447X5A Adverse effect of beta-adrenoreceptor antagonists, initial encounter: Secondary | ICD-10-CM | POA: Diagnosis present

## 2024-03-16 DIAGNOSIS — E782 Mixed hyperlipidemia: Secondary | ICD-10-CM | POA: Diagnosis present

## 2024-03-16 DIAGNOSIS — Z90710 Acquired absence of both cervix and uterus: Secondary | ICD-10-CM | POA: Diagnosis not present

## 2024-03-16 DIAGNOSIS — Z9049 Acquired absence of other specified parts of digestive tract: Secondary | ICD-10-CM

## 2024-03-16 DIAGNOSIS — Z86718 Personal history of other venous thrombosis and embolism: Secondary | ICD-10-CM

## 2024-03-16 DIAGNOSIS — I129 Hypertensive chronic kidney disease with stage 1 through stage 4 chronic kidney disease, or unspecified chronic kidney disease: Secondary | ICD-10-CM | POA: Diagnosis present

## 2024-03-16 DIAGNOSIS — N1831 Chronic kidney disease, stage 3a: Secondary | ICD-10-CM | POA: Diagnosis present

## 2024-03-16 DIAGNOSIS — Z7982 Long term (current) use of aspirin: Secondary | ICD-10-CM

## 2024-03-16 DIAGNOSIS — Z825 Family history of asthma and other chronic lower respiratory diseases: Secondary | ICD-10-CM | POA: Diagnosis not present

## 2024-03-16 DIAGNOSIS — N179 Acute kidney failure, unspecified: Secondary | ICD-10-CM | POA: Diagnosis present

## 2024-03-16 DIAGNOSIS — Z79899 Other long term (current) drug therapy: Secondary | ICD-10-CM | POA: Diagnosis not present

## 2024-03-16 DIAGNOSIS — E860 Dehydration: Secondary | ICD-10-CM | POA: Diagnosis present

## 2024-03-16 DIAGNOSIS — Z853 Personal history of malignant neoplasm of breast: Secondary | ICD-10-CM

## 2024-03-16 DIAGNOSIS — Z7951 Long term (current) use of inhaled steroids: Secondary | ICD-10-CM | POA: Diagnosis not present

## 2024-03-16 DIAGNOSIS — I714 Abdominal aortic aneurysm, without rupture, unspecified: Secondary | ICD-10-CM | POA: Diagnosis present

## 2024-03-16 DIAGNOSIS — I10 Essential (primary) hypertension: Secondary | ICD-10-CM | POA: Diagnosis not present

## 2024-03-16 DIAGNOSIS — I7300 Raynaud's syndrome without gangrene: Secondary | ICD-10-CM | POA: Diagnosis present

## 2024-03-16 DIAGNOSIS — R001 Bradycardia, unspecified: Principal | ICD-10-CM | POA: Diagnosis present

## 2024-03-16 DIAGNOSIS — Y92009 Unspecified place in unspecified non-institutional (private) residence as the place of occurrence of the external cause: Secondary | ICD-10-CM

## 2024-03-16 DIAGNOSIS — K219 Gastro-esophageal reflux disease without esophagitis: Secondary | ICD-10-CM | POA: Diagnosis present

## 2024-03-16 LAB — CBC
HCT: 34.4 % — ABNORMAL LOW (ref 36.0–46.0)
Hemoglobin: 11.1 g/dL — ABNORMAL LOW (ref 12.0–15.0)
MCH: 30.6 pg (ref 26.0–34.0)
MCHC: 32.3 g/dL (ref 30.0–36.0)
MCV: 94.8 fL (ref 80.0–100.0)
Platelets: 244 K/uL (ref 150–400)
RBC: 3.63 MIL/uL — ABNORMAL LOW (ref 3.87–5.11)
RDW: 12.9 % (ref 11.5–15.5)
WBC: 8.6 K/uL (ref 4.0–10.5)
nRBC: 0 % (ref 0.0–0.2)

## 2024-03-16 LAB — T4, FREE: Free T4: 0.88 ng/dL (ref 0.61–1.12)

## 2024-03-16 LAB — BASIC METABOLIC PANEL WITH GFR
Anion gap: 12 (ref 5–15)
BUN: 39 mg/dL — ABNORMAL HIGH (ref 8–23)
CO2: 24 mmol/L (ref 22–32)
Calcium: 8.5 mg/dL — ABNORMAL LOW (ref 8.9–10.3)
Chloride: 102 mmol/L (ref 98–111)
Creatinine, Ser: 2.02 mg/dL — ABNORMAL HIGH (ref 0.44–1.00)
GFR, Estimated: 23 mL/min — ABNORMAL LOW (ref >60)
Glucose, Bld: 118 mg/dL — ABNORMAL HIGH (ref 70–99)
Potassium: 4.1 mmol/L (ref 3.5–5.1)
Sodium: 138 mmol/L (ref 135–145)

## 2024-03-16 LAB — URINALYSIS, COMPLETE (UACMP) WITH MICROSCOPIC
Bacteria, UA: NONE SEEN
Bilirubin Urine: NEGATIVE
Glucose, UA: NEGATIVE mg/dL
Hgb urine dipstick: NEGATIVE
Ketones, ur: NEGATIVE mg/dL
Leukocytes,Ua: NEGATIVE
Nitrite: NEGATIVE
Protein, ur: NEGATIVE mg/dL
Specific Gravity, Urine: 1.008 (ref 1.005–1.030)
pH: 5 (ref 5.0–8.0)

## 2024-03-16 LAB — MAGNESIUM: Magnesium: 2 mg/dL (ref 1.7–2.4)

## 2024-03-16 LAB — TROPONIN I (HIGH SENSITIVITY)
Troponin I (High Sensitivity): 16 ng/L (ref <18)
Troponin I (High Sensitivity): 13 ng/L (ref <18)

## 2024-03-16 LAB — TSH: TSH: 6.539 u[IU]/mL — ABNORMAL HIGH (ref 0.350–4.500)

## 2024-03-16 LAB — PHOSPHORUS: Phosphorus: 5.3 mg/dL — ABNORMAL HIGH (ref 2.5–4.6)

## 2024-03-16 MED ORDER — HYDRALAZINE HCL 20 MG/ML IJ SOLN: 5.0000 mg | INTRAMUSCULAR | Status: DC | PRN

## 2024-03-16 MED ORDER — MELATONIN 5 MG PO TABS
5.0000 mg | ORAL_TABLET | Freq: Once | ORAL | Status: AC
  Administered 2024-03-17: 5 mg via ORAL
  Filled 2024-03-16: qty 1

## 2024-03-16 MED ORDER — SENNOSIDES-DOCUSATE SODIUM 8.6-50 MG PO TABS: 1.0000 | ORAL_TABLET | Freq: Every evening | ORAL | Status: DC | PRN

## 2024-03-16 MED ORDER — ONDANSETRON HCL 4 MG/2ML IJ SOLN: 4.0000 mg | Freq: Four times a day (QID) | INTRAMUSCULAR | Status: DC | PRN

## 2024-03-16 MED ORDER — LACTATED RINGERS IV BOLUS
1000.0000 mL | Freq: Once | INTRAVENOUS | Status: AC
  Administered 2024-03-16: 1000 mL via INTRAVENOUS

## 2024-03-16 MED ORDER — ACETAMINOPHEN 325 MG PO TABS
650.0000 mg | ORAL_TABLET | Freq: Four times a day (QID) | ORAL | Status: DC | PRN
  Administered 2024-03-17 (×2): 650 mg via ORAL
  Filled 2024-03-16 (×3): qty 2

## 2024-03-16 MED ORDER — HEPARIN SODIUM (PORCINE) 5000 UNIT/ML IJ SOLN
5000.0000 [IU] | Freq: Three times a day (TID) | INTRAMUSCULAR | Status: DC
  Administered 2024-03-16 – 2024-03-18 (×5): 5000 [IU] via SUBCUTANEOUS
  Filled 2024-03-16 (×5): qty 1

## 2024-03-16 MED ORDER — HYDRALAZINE HCL 20 MG/ML IJ SOLN
5.0000 mg | Freq: Four times a day (QID) | INTRAMUSCULAR | Status: DC | PRN
  Administered 2024-03-16: 5 mg via INTRAVENOUS
  Filled 2024-03-16: qty 1

## 2024-03-16 MED ORDER — PRAVASTATIN SODIUM 20 MG PO TABS
20.0000 mg | ORAL_TABLET | Freq: Every day | ORAL | Status: DC
  Administered 2024-03-16 – 2024-03-17 (×2): 20 mg via ORAL
  Filled 2024-03-16 (×2): qty 1

## 2024-03-16 MED ORDER — FLUTICASONE PROPIONATE 50 MCG/ACT NA SUSP
2.0000 | Freq: Every day | NASAL | Status: DC | PRN
  Administered 2024-03-17: 2 via NASAL
  Filled 2024-03-16: qty 16

## 2024-03-16 MED ORDER — ONDANSETRON HCL 4 MG PO TABS: 4.0000 mg | ORAL_TABLET | Freq: Four times a day (QID) | ORAL | Status: DC | PRN

## 2024-03-16 MED ORDER — ACETAMINOPHEN 650 MG RE SUPP: 650.0000 mg | Freq: Four times a day (QID) | RECTAL | Status: DC | PRN

## 2024-03-16 MED ORDER — SODIUM CHLORIDE 0.9 % IV SOLN: INTRAVENOUS | Status: DC

## 2024-03-16 NOTE — Assessment & Plan Note (Signed)
 Home pravastatin 20 mg every evening resumed

## 2024-03-16 NOTE — Assessment & Plan Note (Addendum)
 Baseline CKD 3A Creatinine improving with IV fluid, at 1.32 today with baseline around 1.  Likely secondary to poor p.o. intake and continue taking HCTZ and losartan . Continue with gentle IV fluid for another day- -monitor renal function - Avoid nephrotoxins

## 2024-03-16 NOTE — H&P (Signed)
 History and Physical   Traci Quinn FMW:[id number] DOB: 1934-07-27 DOA: 03/16/2024  PCP: Traci Quinn ORN, MD  Outpatient Specialists: Dr. Darron, [facility] cardiology Patient coming from: Home  I have personally briefly reviewed patient's old medical records in [facility] EMR.  Chief Concern: Fatigue  HPI: [name] is an 89 year old female with history of DVT, primary hypertension, hyperlipidemia, Raynaud's phenomenon without gangrene, who presents ED for chief concerns of 4 weeks of fatigue.  Vitals in the ED showed T 98, respiration rate 16, heart rate 41, blood pressure 154/67, SpO2 98% on room air.  Serum sodium is 138, potassium 4.1, chloride 102, bicarb 24, BUN of 39, serum creatinine of 2.02, EGFR 23, nonfasting glucose 118, WBC 8.6, hemoglobin 11.1, platelets of 244. Magnesium  Initial HS troponin 16.  TSH is 6.539.  Free T4 is 0.88.  Phosphorus 5.3.  2.0.  ED treatment: LR 1 L bolus -------------------------------------- At bedside, patient able to tell me her first and last name, age, location, current calendar year.  Patient reports that she has been feeling fatigued for the last 3 to 4 weeks.  She reports that at baseline she does not drink a lot of water.  In the morning she drinks coffee and throughout the day she will drink Pepsi, Cokes.  She rarely drinks tea.  Family reports that she does not drink water almost at all.  She reports increased urination over the last 2 to 3 days.  She denies dysuria, hematuria, diarrhea, blood in her stool, nausea, vomiting, decreased p.o. intake, changes to appetite.  She reports her appetite is pretty good.  She denies trauma to her person.  She denies swelling of her lower extremities.  She endorses baseline fingers and toes turning blue that is her Raynaud's syndrome.  She denies being on medication for this anymore.  Social history: She lives at home.  She is widowed for about 6 years.  They were married for 56 years.   She denies tobacco, EtOH, recreational drug use.  ROS: Constitutional: no weight change, no fever ENT/Mouth: no sore throat, no rhinorrhea Eyes: no eye pain, no vision changes Cardiovascular: no chest pain, no dyspnea,  no edema, no palpitations Respiratory: no cough, no sputum, no wheezing Gastrointestinal: no nausea, no vomiting, no diarrhea, no constipation Genitourinary: no urinary incontinence, no dysuria, no hematuria, + increased urination Musculoskeletal: no arthralgias, no myalgias Skin: no skin lesions, no pruritus, Neuro: + weakness, + increased fatigue, no loss of consciousness, no syncope Psych: no anxiety, no depression, no decrease appetite Heme/Lymph: no bruising, no bleeding  ED Course: Discussed with EDP, patient requiring hospitalization for chief concerns of symptomatic bradycardia.  Assessment/Plan  Principal Problem:   Symptomatic bradycardia Active Problems:   AKI (acute kidney injury) (HCC)   Essential hypertension, benign   GERD (gastroesophageal reflux disease)   Combined hyperlipidemia   PA (pernicious anemia)   Raynaud's phenomenon without gangrene   CKD stage 3a, GFR 45-59 ml/min (HCC)   Assessment and Plan:  * Symptomatic bradycardia Suspect secondary to continued propranolol use in setting of acute kidney injury Hold home propranolol Continue telemetry cardiac monitoring  AKI (acute kidney injury) (HCC) Baseline CKD 3A Unclear etiology at this time, I suspect insufficient oral hydration UTI cannot be excluded at this time Status post LR 1 L bolus per EP Continue sodium chloride  infusion at 125 mL/h, 1 day ordered Check UA BMP in a.m. If renal function does not improve tomorrow, a.m. team to consult nephrology  Combined  hyperlipidemia Home pravastatin 20 mg every evening resumed  Essential hypertension, benign Hydralazine 5 mg IV every 6 hours as needed for SBP greater 180, 5 days ordered Home hydrochlorothiazide 25 mg daily,  losartan hydrochlorothiazide 100-12.5 mg daily will not be resumed on admission in setting of acute kidney injury Home propranolol will be held on admission in setting of symptomatic bradycardia AM team to resume home antihypertensive medications when benefits outweigh the risk  Chart reviewed.   DVT prophylaxis: Heparin 5000 units subcutaneous every 8 hours Code Status: Full code Diet: Heart healthy Family Communication: Updated daughter, Traci Quinn and granddaughter Traci Quinn at bedside with patient's permission Disposition Plan: Pending clinical course Consults called: None at this time Admission status: Telemetry cardiac, inpatient  Past Medical History:  Diagnosis Date   AAA (abdominal aortic aneurysm) (HCC)    Arthritis    Asthma    Bladder dysfunction    Cancer (HCC)    left sided breast cancer   Diverticulosis    DVT (deep venous thrombosis) (HCC)    GERD (gastroesophageal reflux disease)    Hyperlipidemia    Hypertension    Migraines    Spastic colon    Past Surgical History:  Procedure Laterality Date   ABDOMINAL HYSTERECTOMY     APPENDECTOMY     BREAST EXCISIONAL BIOPSY Right 1970   neg surgical exc   BREAST LUMPECTOMY Left 2000   rad and no chemo   BREAST SURGERY     lumpectomy   CHOLECYSTECTOMY     COLONOSCOPY WITH PROPOFOL  N/A 04/14/2022   Procedure: COLONOSCOPY WITH PROPOFOL ;  Surgeon: Traci Bi, MD;  Location: [facility] ENDOSCOPY;  Service: Gastroenterology;  Laterality: N/A;   ESOPHAGOGASTRODUODENOSCOPY N/A 04/14/2022   Procedure: ESOPHAGOGASTRODUODENOSCOPY (EGD);  Surgeon: Traci Bi, MD;  Location: [facility] ENDOSCOPY;  Service: Gastroenterology;  Laterality: N/A;   ESOPHAGOGASTRODUODENOSCOPY (EGD) WITH PROPOFOL  N/A 08/06/2016   Procedure: ESOPHAGOGASTRODUODENOSCOPY (EGD) WITH PROPOFOL ;  Surgeon: Traci RAYMOND Mariner, MD;  Location: [facility] ENDOSCOPY;  Service: Endoscopy;  Laterality: N/A;   Social History:  reports that she has never smoked. She has never used smokeless  tobacco. She reports that she does not drink alcohol and does not use drugs.  Allergies  Allergen Reactions   Atorvastatin     Other reaction(s): Muscle Pain   Celecoxib     Other reaction(s): Other (See Comments) GI bleed   Indomethacin Nausea And Vomiting    Other reaction(s): Headache   Zetia [Ezetimibe]    Family History  Problem Relation Age of Onset   Cancer Mother    Cancer Father    COPD Daughter    Family history: Family history reviewed and not pertinent.  Prior to Admission medications   Medication Sig Start Date End Date Taking? Authorizing Provider  amitriptyline (ELAVIL) 25 MG tablet Take 25 mg by mouth at bedtime. 12/08/13   [provider]  aspirin 81 MG tablet Take 81 mg by mouth daily.    [provider]  budesonide-formoterol (SYMBICORT) 160-4.5 MCG/ACT inhaler Inhale 2 puffs into the lungs as needed.    [provider]  fluticasone (VERAMYST) 27.5 MCG/SPRAY nasal spray Place 2 sprays into the nose as needed for rhinitis.    [provider]  hydrochlorothiazide (HYDRODIURIL) 25 MG tablet Take 25 mg by mouth daily. 04/14/21   [provider]  losartan-hydrochlorothiazide (HYZAAR) 100-12.5 MG per tablet Take 1 tablet by mouth daily. 10/17/13   [provider]  lovastatin (MEVACOR) 20 MG tablet Take 20 mg by mouth at bedtime.  11/12/13   [provider]  propranolol ER (INDERAL LA) 120 MG 24 hr capsule Take 120 mg by mouth daily. 12/08/13   [provider]   Physical Exam: Vitals:   03/16/24 1405 03/16/24 1409 03/16/24 1430 03/16/24 1441  BP:   (!) 192/65 (!) 192/65  Pulse: (!) 52  60   Resp: 13 17 (!) 22   Temp:      TempSrc:      SpO2: 99%  100%   Weight:       Constitutional: appears age-appropriate, frail, pleasant, calm calm Eyes: PERRL, lids and conjunctivae normal ENMT: Mucous membranes are moist. Posterior pharynx clear of any exudate or lesions. Age-appropriate dentition. Hearing  appropriate Neck: normal, supple, no masses, no thyromegaly Respiratory: clear to auscultation bilaterally, no wheezing, no crackles. Normal respiratory effort. No accessory muscle use.  Cardiovascular: Bradycardia with regular rhythm, no murmurs / rubs / gallops. No extremity edema. 2+ pedal pulses. No carotid bruits.  Abdomen: no tenderness, no masses palpated, no hepatosplenomegaly. Bowel sounds positive.  Musculoskeletal: no clubbing / cyanosis. No joint deformity upper and lower extremities. Good ROM, no contractures, no atrophy. Normal muscle tone.  Skin: no rashes, lesions, ulcers. No induration Neurologic: Sensation intact. Strength 5/5 in all 4.  Psychiatric: Normal judgment and insight. Alert and oriented x 3. Normal mood.   EKG: independently reviewed, showing junctional bradycardia with rate of 41, QTc 429  Chest x-ray on Admission: I personally reviewed and I agree with radiologist reading as below.  DG Chest Port 1 View Result Date: 03/16/2024 CLINICAL DATA:  Bradycardia. EXAM: PORTABLE CHEST 1 VIEW COMPARISON:  None Available. FINDINGS: No focal consolidation, pleural effusion or pneumothorax. The cardiac silhouette is within normal limits. Atherosclerotic calcification of the aorta. No acute osseous pathology. Scoliosis. IMPRESSION: No active disease. Electronically Signed   By: Vanetta Chou M.D.   On: 03/16/2024 12:37   Labs on Admission: I have personally reviewed following labs  CBC: Recent Labs  Lab 03/16/24 1208  WBC 8.6  HGB 11.1*  HCT 34.4*  MCV 94.8  PLT 244   Basic Metabolic Panel: Recent Labs  Lab 03/16/24 1208  NA 138  K 4.1  CL 102  CO2 24  GLUCOSE 118*  BUN 39*  CREATININE 2.02*  CALCIUM 8.5*  MG 2.0  PHOS 5.3*   GFR: Estimated Creatinine Clearance: 15.5 mL/min (A) (by C-G formula based on SCr of 2.02 mg/dL (H)).  Thyroid Function Tests: Recent Labs    03/16/24 1208  TSH 6.539*  FREET4 0.88   This document was prepared using  Dragon Voice Recognition software and may include unintentional dictation errors.  Dr. Sherre [facility]  If 7PM-7AM, please contact overnight-coverage provider If 7AM-7PM, please contact day attending provider [email or web address]  03/16/2024, 3:17 PM

## 2024-03-16 NOTE — ED Notes (Signed)
 MD made aware of change in ECG rhythm from junctional rhythm to sinus bradycardia.

## 2024-03-16 NOTE — Assessment & Plan Note (Addendum)
 Blood pressure elevated this morning. Patient was on HCTZ separately along with Hyzaar and propranolol at home.  Home HCTZ was discontinued due to duplication. Holding home his ARB and propranolol due to AKI and concern of symptomatic bradycardia. - Starting on amlodipine  Hydralazine  5 mg IV every 6 hours as needed for SBP greater 180, 5 days ordered

## 2024-03-16 NOTE — ED Triage Notes (Signed)
 Sent to ED afte Home Health nurse took patinet's VS today and HR 38.  Patient reports feeling fatigued x 4 weeks after being sick with a cough.  AAOx3.  Skin warm and dry. NAD

## 2024-03-16 NOTE — Hospital Course (Addendum)
 [name] is an 89 year old female with history of DVT, primary hypertension, hyperlipidemia, Raynaud's phenomenon without gangrene, who presents ED for chief concerns of 4 weeks of fatigue.  At baseline patient does not drink enough water, only drinks some soda during the day and coffee in the morning.  She was also having some increased urinary frequency without any dysuria over the past 2 to 3 days.  Vitals in the ED showed T 98, respiration rate 16, heart rate 41, blood pressure 154/67, SpO2 98% on room air.  Serum sodium is 138, potassium 4.1, chloride 102, bicarb 24, BUN of 39, serum creatinine of 2.02, EGFR 23, nonfasting glucose 118, WBC 8.6, hemoglobin 11.1, platelets of 244. Magnesium 2.0, Initial HS troponin 16.  TSH is 6.539.  Free T4 is 0.88.  Phosphorus 5.3.   Patient received 1 L of bolus in ED and was admitted for concern of symptomatic bradycardia likely secondary to propranolol use with AKI.  7/26: Vitals with heart rate of 61 and blood pressure elevated at 177/64, labs with hemoglobin decreased to 9.5 but also line decreased, might be concentrated sample on admission, creatinine improved to 1.32, baseline seems to be around 1, UA was not consistent with UTI. Starting on amlodipine  as home HCTZ's-losartan  is being held for concern of AKI. PT is recommending home health.  7/27: Remained hemodynamically stable.  Renal functions back to baseline, restarting home his are and home propranolol was discontinued as heart rate remained in 60s.  Patient was instructed to keep herself well-hydrated, continue taking current medications and follow-up closely with primary care provider and they can add another agent for hypertension if needed.

## 2024-03-16 NOTE — Telephone Encounter (Signed)
 Received an incoming phone call the STAT phone from Juna Thao, NP regarding the patient's heart rate being the mid to high 30s and getting up to the low 40s  with activity and then promptly returning to the 30s.  According to Juna, patient complains about lacking stamina with accompanying shortness of breath while doing normal activities after her upper respiratory infection a few weeks ago.  Patient does not complain of any chest pain.  This RN advised Juna to have the patient go the Emergency room for a thorough evaluation.  Juna Thao, NP verbalized agreement.

## 2024-03-16 NOTE — Assessment & Plan Note (Addendum)
 Suspect secondary to continued propranolol use in setting of acute kidney injury Heart rate improved today Hold home propranolol-will discontinue on discharge Continue telemetry cardiac monitoring

## 2024-03-16 NOTE — Telephone Encounter (Signed)
  STAT if HR is under 50 or over 120 (normal HR is 60-100 beats per minute)  What is your heart rate? 38 resting HR   Do you have a log of your heart rate readings (document readings)? 38-40s  Do you have any other symptoms?  Fatigue, sob, feeling not her self

## 2024-03-16 NOTE — ED Provider Notes (Signed)
 [facility] Provider Note    Event Date/Time   First MD Initiated Contact with Patient 03/16/24 1159     (approximate)   History   Chief Complaint: Bradycardia   HPI  Traci Quinn is a 89 y.o. female with a history of GERD hypertension who comes ED complaining of shortness of breath fatigue, occasional dizziness which has been gradually onset and worsening over the past 2 weeks.  Denies chest pain.  Did recently have a cough and upper respiratory infection about a month ago.        Past Medical History:  Diagnosis Date   AAA (abdominal aortic aneurysm) (HCC)    Arthritis    Asthma    Bladder dysfunction    Cancer (HCC)    left sided breast cancer   Diverticulosis    DVT (deep venous thrombosis) (HCC)    GERD (gastroesophageal reflux disease)    Hyperlipidemia    Hypertension    Migraines    Spastic colon     Current Outpatient Rx   Order #: [id number] Class: Historical Med   Order #: [id number] Class: Historical Med   Order #: [id number] Class: Historical Med   Order #: [id number] Class: Historical Med   Order #: [id number] Class: Historical Med   Order #: [id number] Class: Historical Med   Order #: [id number] Class: Historical Med   Order #: [id number] Class: Historical Med    Past Surgical History:  Procedure Laterality Date   ABDOMINAL HYSTERECTOMY     APPENDECTOMY     BREAST EXCISIONAL BIOPSY Right 1970   neg surgical exc   BREAST LUMPECTOMY Left 2000   rad and no chemo   BREAST SURGERY     lumpectomy   CHOLECYSTECTOMY     COLONOSCOPY WITH PROPOFOL  N/A 04/14/2022   Procedure: COLONOSCOPY WITH PROPOFOL ;  Surgeon: Therisa Bi, MD;  Location: [facility] ENDOSCOPY;  Service: Gastroenterology;  Laterality: N/A;   ESOPHAGOGASTRODUODENOSCOPY N/A 04/14/2022   Procedure: ESOPHAGOGASTRODUODENOSCOPY (EGD);  Surgeon: Therisa Bi, MD;  Location: [facility] ENDOSCOPY;  Service: Gastroenterology;  Laterality: N/A;   ESOPHAGOGASTRODUODENOSCOPY (EGD) WITH  PROPOFOL  N/A 08/06/2016   Procedure: ESOPHAGOGASTRODUODENOSCOPY (EGD) WITH PROPOFOL ;  Surgeon: Gladis RAYMOND Mariner, MD;  Location: [facility] ENDOSCOPY;  Service: Endoscopy;  Laterality: N/A;    Physical Exam   Triage Vital Signs: ED Triage Vitals  Encounter Vitals Group     BP 03/16/24 1157 (!) 154/67     Girls Systolic BP Percentile --      Girls Diastolic BP Percentile --      Boys Systolic BP Percentile --      Boys Diastolic BP Percentile --      Pulse Rate 03/16/24 1157 (!) 41     Resp 03/16/24 1157 16     Temp 03/16/24 1157 98 F (36.7 C)     Temp Source 03/16/24 1157 Oral     SpO2 03/16/24 1157 98 %     Weight 03/16/24 1156 130 lb 1.1 oz (59 kg)     Height --      Head Circumference --      Peak Flow --      Pain Score 03/16/24 1156 0     Pain Loc --      Pain Education --      Exclude from Growth Chart --     Most recent vital signs: Vitals:   03/16/24 1300 03/16/24 1330  BP: (!) 151/62 (!) 151/61  Pulse: (!) 38 (!) 36  Resp: 15 10  Temp:  SpO2: 100% 100%    General: Awake, no distress.  CV:  Good peripheral perfusion.  Bradycardia heart rate 40.  Symmetric distal pulses Resp:  Normal effort.  Clear to auscultation bilaterally Abd:  No distention.  Soft nontender Other:  No lower extremity edema   ED Results / Procedures / Treatments   Labs (all labs ordered are listed, but only abnormal results are displayed) Labs Reviewed  BASIC METABOLIC PANEL WITH GFR - Abnormal; Notable for the following components:      Result Value   Glucose, Bld 118 (*)    BUN 39 (*)    Creatinine, Ser 2.02 (*)    Calcium 8.5 (*)    GFR, Estimated 23 (*)    All other components within normal limits  CBC - Abnormal; Notable for the following components:   RBC 3.63 (*)    Hemoglobin 11.1 (*)    HCT 34.4 (*)    All other components within normal limits  PHOSPHORUS - Abnormal; Notable for the following components:   Phosphorus 5.3 (*)    All other components within normal  limits  TSH - Abnormal; Notable for the following components:   TSH 6.539 (*)    All other components within normal limits  MAGNESIUM  T4, FREE  TROPONIN I (HIGH SENSITIVITY)  TROPONIN I (HIGH SENSITIVITY)     EKG Interpreted by me Junctional bradycardia rate of 41.  Right axis, normal intervals.  No acute ischemic changes.   RADIOLOGY Chest x-ray interpreted by me, unremarkable   PROCEDURES:  .Critical Care  Performed by: Viviann Pastor, MD Authorized by: Viviann Pastor, MD   Critical care provider statement:    Critical care time (minutes):  35   Critical care time was exclusive of:  Separately billable procedures and treating other patients   Critical care was necessary to treat or prevent imminent or life-threatening deterioration of the following conditions:  Cardiac failure   Critical care was time spent personally by me on the following activities:  Development of treatment plan with patient or surrogate, discussions with consultants, evaluation of patient's response to treatment, examination of patient, obtaining history from patient or surrogate, ordering and performing treatments and interventions, ordering and review of laboratory studies, ordering and review of radiographic studies, pulse oximetry, re-evaluation of patient's condition and review of old charts   Care discussed with: admitting provider      MEDICATIONS ORDERED IN ED: Medications  acetaminophen (TYLENOL) tablet 650 mg (has no administration in time range)    Or  acetaminophen (TYLENOL) suppository 650 mg (has no administration in time range)  ondansetron (ZOFRAN) tablet 4 mg (has no administration in time range)    Or  ondansetron (ZOFRAN) injection 4 mg (has no administration in time range)  heparin injection 5,000 Units (has no administration in time range)  senna-docusate (Senokot-S) tablet 1 tablet (has no administration in time range)  lactated ringers bolus 1,000 mL (1,000 mLs  Intravenous New Bag/Given 03/16/24 1344)     IMPRESSION / MDM / ASSESSMENT AND PLAN / ED COURSE  I reviewed the triage vital signs and the nursing notes.  DDx: Non-STEMI, hypothyroidism, electrolyte derangement, AKI, dehydration, pleural effusion, pneumonia  Patient's presentation is most consistent with acute presentation with potential threat to life or bodily function.  Patient presents with fatigue, shortness of breath, found to be bradycardic.  Blood pressures okay.  Labs unremarkable except for AKI with creatinine of 2.0 compared to baseline of 1.0.  Patient monitored closely on telemetry  with pacer pads in place.  Not requiring ACLS/hemodynamic support at this time.  Case discussed with hospitalist for further management.       FINAL CLINICAL IMPRESSION(S) / ED DIAGNOSES   Final diagnoses:  Symptomatic bradycardia  AKI (acute kidney injury) (HCC)     Rx / DC Orders   ED Discharge Orders     None        Note:  This document was prepared using Dragon voice recognition software and may include unintentional dictation errors.   Viviann Pastor, MD 03/16/24 1357

## 2024-03-17 DIAGNOSIS — I7300 Raynaud's syndrome without gangrene: Secondary | ICD-10-CM

## 2024-03-17 DIAGNOSIS — I10 Essential (primary) hypertension: Secondary | ICD-10-CM | POA: Diagnosis not present

## 2024-03-17 DIAGNOSIS — N1831 Chronic kidney disease, stage 3a: Secondary | ICD-10-CM | POA: Diagnosis not present

## 2024-03-17 DIAGNOSIS — R001 Bradycardia, unspecified: Secondary | ICD-10-CM | POA: Diagnosis not present

## 2024-03-17 DIAGNOSIS — N179 Acute kidney failure, unspecified: Secondary | ICD-10-CM

## 2024-03-17 DIAGNOSIS — E782 Mixed hyperlipidemia: Secondary | ICD-10-CM

## 2024-03-17 DIAGNOSIS — D510 Vitamin B12 deficiency anemia due to intrinsic factor deficiency: Secondary | ICD-10-CM

## 2024-03-17 DIAGNOSIS — K219 Gastro-esophageal reflux disease without esophagitis: Secondary | ICD-10-CM

## 2024-03-17 LAB — BASIC METABOLIC PANEL WITH GFR
Anion gap: 7 (ref 5–15)
BUN: 30 mg/dL — ABNORMAL HIGH (ref 8–23)
CO2: 24 mmol/L (ref 22–32)
Calcium: 8.4 mg/dL — ABNORMAL LOW (ref 8.9–10.3)
Chloride: 109 mmol/L (ref 98–111)
Creatinine, Ser: 1.32 mg/dL — ABNORMAL HIGH (ref 0.44–1.00)
GFR, Estimated: 39 mL/min — ABNORMAL LOW (ref >60)
Glucose, Bld: 100 mg/dL — ABNORMAL HIGH (ref 70–99)
Potassium: 3.8 mmol/L (ref 3.5–5.1)
Sodium: 140 mmol/L (ref 135–145)

## 2024-03-17 LAB — CBC
HCT: 29.5 % — ABNORMAL LOW (ref 36.0–46.0)
Hemoglobin: 9.5 g/dL — ABNORMAL LOW (ref 12.0–15.0)
MCH: 30.1 pg (ref 26.0–34.0)
MCHC: 32.2 g/dL (ref 30.0–36.0)
MCV: 93.4 fL (ref 80.0–100.0)
Platelets: 200 K/uL (ref 150–400)
RBC: 3.16 MIL/uL — ABNORMAL LOW (ref 3.87–5.11)
RDW: 12.6 % (ref 11.5–15.5)
WBC: 5.8 K/uL (ref 4.0–10.5)
nRBC: 0 % (ref 0.0–0.2)

## 2024-03-17 MED ORDER — AMLODIPINE BESYLATE 5 MG PO TABS
5.0000 mg | ORAL_TABLET | Freq: Every day | ORAL | Status: DC
  Administered 2024-03-17: 5 mg via ORAL
  Filled 2024-03-17 (×2): qty 1

## 2024-03-17 MED ORDER — ASPIRIN 81 MG PO TBEC
81.0000 mg | DELAYED_RELEASE_TABLET | Freq: Every day | ORAL | Status: DC
  Administered 2024-03-17 – 2024-03-18 (×2): 81 mg via ORAL
  Filled 2024-03-17 (×2): qty 1

## 2024-03-17 MED ORDER — MELATONIN 5 MG PO TABS
5.0000 mg | ORAL_TABLET | Freq: Once | ORAL | Status: AC
  Administered 2024-03-17: 5 mg via ORAL
  Filled 2024-03-17: qty 1

## 2024-03-17 MED ORDER — SODIUM CHLORIDE 0.9 % IV SOLN: INTRAVENOUS | Status: DC

## 2024-03-17 NOTE — Progress Notes (Signed)
 Progress Note   Patient: Traci Quinn FMW:[id number] DOB: [birth year] DOA: 03/16/2024     1 DOS: the patient was seen and examined on 03/17/2024   Brief hospital course: [name] is an 89 year old female with history of DVT, primary hypertension, hyperlipidemia, Raynaud's phenomenon without gangrene, who presents ED for chief concerns of 4 weeks of fatigue.  At baseline patient does not drink enough water, only drinks some soda during the day and coffee in the morning.  She was also having some increased urinary frequency without any dysuria over the past 2 to 3 days.  Vitals in the ED showed T 98, respiration rate 16, heart rate 41, blood pressure 154/67, SpO2 98% on room air.  Serum sodium is 138, potassium 4.1, chloride 102, bicarb 24, BUN of 39, serum creatinine of 2.02, EGFR 23, nonfasting glucose 118, WBC 8.6, hemoglobin 11.1, platelets of 244. Magnesium 2.0, Initial HS troponin 16.  TSH is 6.539.  Free T4 is 0.88.  Phosphorus 5.3.   Patient received 1 L of bolus in ED and was admitted for concern of symptomatic bradycardia likely secondary to propranolol use with AKI.  7/26: Vitals with heart rate of 61 and blood pressure elevated at 177/64, labs with hemoglobin decreased to 9.5 but also line decreased, might be concentrated sample on admission, creatinine improved to 1.32, baseline seems to be around 1, UA was not consistent with UTI. Starting on amlodipine  as home HCTZ's-losartan  is being held for concern of AKI. PT is recommending home health.  Assessment and Plan: * Symptomatic bradycardia Suspect secondary to continued propranolol use in setting of acute kidney injury Heart rate improved today Hold home propranolol-will discontinue on discharge Continue telemetry cardiac monitoring  AKI (acute kidney injury) (HCC) Baseline CKD 3A Creatinine improving with IV fluid, at 1.32 today with baseline around 1.  Likely secondary to poor p.o. intake and continue taking  HCTZ and losartan . Continue with gentle IV fluid for another day- -monitor renal function - Avoid nephrotoxins  Essential hypertension, benign Blood pressure elevated this morning. Patient was on HCTZ separately along with Hyzaar and propranolol at home.  Home HCTZ was discontinued due to duplication. Holding home his ARB and propranolol due to AKI and concern of symptomatic bradycardia. - Starting on amlodipine  Hydralazine  5 mg IV every 6 hours as needed for SBP greater 180, 5 days ordered   Combined hyperlipidemia Home pravastatin  20 mg every evening resumed   Subjective: Patient was seen and examined today.  Still feeling little weak and dizzy.  We discussed about the importance of keeping herself well-hydrated and avoid drinking a lot of Coke and Sprite.  Physical Exam: Vitals:   03/16/24 2324 03/17/24 0819 03/17/24 0900 03/17/24 1157  BP: (!) 187/69 (!) 177/64 (!) 163/67 (!) 155/69  Pulse: (!) 58 61  (!) 57  Resp: 18 14 18 12   Temp: 98.2 F (36.8 C) 98.2 F (36.8 C)  98.6 F (37 C)  TempSrc:      SpO2: 100% 95%  97%  Weight:       General. Frail elderly lady, in no acute distress. Pulmonary.  Lungs clear bilaterally, normal respiratory effort. CV.  Regular rate and rhythm, no JVD, rub or murmur. Abdomen.  Soft, nontender, nondistended, BS positive. CNS.  Alert and oriented .  No focal neurologic deficit. Extremities.  No edema,  pulses intact and symmetrical. Psychiatry.  Judgment and insight appears normal.   Data Reviewed:  Prior data reviewed  Family Communication: Tried calling daughter with  no response  Disposition: Status is: Inpatient Remains inpatient appropriate because: Need for 1 more day of IV fluid  Planned Discharge Destination: Home with Home Health  DVT prophylaxis.  Subcu heparin  Time spent: 45 minutes  This record has been created using Conservation officer, historic buildings. Errors have been sought and corrected,but may not always be located.  Such creation errors do not reflect on the standard of care.   Author: Amaryllis Dare, MD 03/17/2024 1:07 PM  For on call review [email or web address].

## 2024-03-17 NOTE — Care Management Important Message (Signed)
 Important Message  Patient Details  Name: Traci Quinn MRN: [id number] Date of Birth: [birth year]   Important Message Given:  Yes - Medicare IM     [name] 03/17/2024, 1:39 PM

## 2024-03-17 NOTE — Evaluation (Signed)
 Physical Therapy Evaluation Patient Details Name: Traci Quinn MRN: [id number] DOB: 11/29/1933 Today's Date: 03/17/2024  History of Present Illness  Pt admitted for symptomatic bradycardia with increased complaints of fatigue. History includes DVT, HTN, HLD, and Raynauds.  Clinical Impression  Imminent dc order received. Pt is a pleasant 89 year old female who was admitted for symptomatic bradycardia. Pt performs bed mobility with supervision, transfers with min assist, and ambulation with CGA and RW. Pt demonstrates deficits with fatigue/mobility/strength. HR monitored throughout. Would benefit from skilled PT to address above deficits and promote optimal return to PLOF. Pt will continue to receive skilled PT services while admitted and will defer to TOC/care team for updates regarding disposition planning. Discussed with care team and can plan to revisit session next date to attempt progress as pt currently is very fatigued with minimal ambulation.       If plan is discharge home, recommend the following: A little help with walking and/or transfers;Help with stairs or ramp for entrance   Can travel by private vehicle        Equipment Recommendations BSC/3in1  Recommendations for Other Services       Functional Status Assessment Patient has had a recent decline in their functional status and demonstrates the ability to make significant improvements in function in a reasonable and predictable amount of time.     Precautions / Restrictions Precautions Precautions: Fall Recall of Precautions/Restrictions: Intact Restrictions Weight Bearing Restrictions Per Provider Order: No      Mobility  Bed Mobility Overal bed mobility: Needs Assistance Bed Mobility: Supine to Sit     Supine to sit: Supervision     General bed mobility comments: ease of mobility. Once seated at EOB, upright posture noted    Transfers Overall transfer level: Needs assistance Equipment used:  Rolling walker (2 wheels) Transfers: Sit to/from Stand Sit to Stand: Min assist           General transfer comment: needs slight physical assistance. Once standing, heavy reliance on RW    Ambulation/Gait Ambulation/Gait assistance: Contact guard assist Gait Distance (Feet): 15 Feet Assistive device: Rolling walker (2 wheels) Gait Pattern/deviations: Step-to pattern       General Gait Details: ambulated short distance in room. Limited by fatigue and self elects to return back to chair. HR at 60bpm prior and elevated to 70bpm with exertion.  Stairs            Wheelchair Mobility     Tilt Bed    Modified Rankin (Stroke Patients Only)       Balance Overall balance assessment: Needs assistance Sitting-balance support: No upper extremity supported Sitting balance-Leahy Scale: Good     Standing balance support: Bilateral upper extremity supported Standing balance-Leahy Scale: Good                               Pertinent Vitals/Pain Pain Assessment Pain Assessment: No/denies pain    Home Living Family/patient expects to be discharged to:: Private residence Living Arrangements: Other relatives (grand children) Available Help at Discharge: Family;Available PRN/intermittently Type of Home: House Home Access: Ramped entrance       Home Layout: One level Home Equipment: Agricultural consultant (2 wheels);Cane - single point      Prior Function Prior Level of Function : Independent/Modified Independent             Mobility Comments: reports she was using SPC at baseline in community, no device  in home, occasionally reaching for walls/furniture ADLs Comments: indep     Extremity/Trunk Assessment   Upper Extremity Assessment Upper Extremity Assessment: Generalized weakness    Lower Extremity Assessment Lower Extremity Assessment: Generalized weakness       Communication   Communication Communication: No apparent difficulties     Cognition Arousal: Alert Behavior During Therapy: WFL for tasks assessed/performed   PT - Cognitive impairments: No apparent impairments                       PT - Cognition Comments: found sleeping, however easily awakens and participates with therapist Following commands: Intact       Cueing Cueing Techniques: Verbal cues     General Comments      Exercises     Assessment/Plan    PT Assessment Patient needs continued PT services  PT Problem List Decreased strength;Decreased activity tolerance;Decreased mobility;Cardiopulmonary status limiting activity       PT Treatment Interventions Gait training;DME instruction;Therapeutic exercise;Balance training    PT Goals (Current goals can be found in the Care Plan section)  Acute Rehab PT Goals Patient Stated Goal: to go home PT Goal Formulation: With patient Time For Goal Achievement: 03/31/24 Potential to Achieve Goals: Good    Frequency Min 2X/week     Co-evaluation               AM-PAC PT 6 Clicks Mobility  Outcome Measure Help needed turning from your back to your side while in a flat bed without using bedrails?: None Help needed moving from lying on your back to sitting on the side of a flat bed without using bedrails?: None Help needed moving to and from a bed to a chair (including a wheelchair)?: A Little Help needed standing up from a chair using your arms (e.g., wheelchair or bedside chair)?: A Little Help needed to walk in hospital room?: A Little Help needed climbing 3-5 steps with a railing? : A Lot 6 Click Score: 19    End of Session Equipment Utilized During Treatment: Gait belt Activity Tolerance: Patient limited by fatigue Patient left: in chair;with chair alarm set Nurse Communication: Mobility status PT Visit Diagnosis: Muscle weakness (generalized) (M62.81);Difficulty in walking, not elsewhere classified (R26.2)    Time: 8874-8858 PT Time Calculation (min) (ACUTE ONLY): 16  min   Charges:   PT Evaluation $PT Eval Low Complexity: 1 Low PT Treatments $Gait Training: 8-22 mins PT General Charges $$ ACUTE PT VISIT: 1 Visit         Corean Dade, PT, DPT, GCS [phone]   Martell Mcfadyen 03/17/2024, 3:14 PM

## 2024-03-18 DIAGNOSIS — I10 Essential (primary) hypertension: Secondary | ICD-10-CM | POA: Diagnosis not present

## 2024-03-18 DIAGNOSIS — N179 Acute kidney failure, unspecified: Secondary | ICD-10-CM | POA: Diagnosis not present

## 2024-03-18 DIAGNOSIS — R001 Bradycardia, unspecified: Secondary | ICD-10-CM | POA: Diagnosis not present

## 2024-03-18 DIAGNOSIS — N1831 Chronic kidney disease, stage 3a: Secondary | ICD-10-CM | POA: Diagnosis not present

## 2024-03-18 LAB — RENAL FUNCTION PANEL
Albumin: 3.2 g/dL — ABNORMAL LOW (ref 3.5–5.0)
Anion gap: 6 (ref 5–15)
BUN: 22 mg/dL (ref 8–23)
CO2: 24 mmol/L (ref 22–32)
Calcium: 8.7 mg/dL — ABNORMAL LOW (ref 8.9–10.3)
Chloride: 108 mmol/L (ref 98–111)
Creatinine, Ser: 0.96 mg/dL (ref 0.44–1.00)
GFR, Estimated: 57 mL/min — ABNORMAL LOW (ref >60)
Glucose, Bld: 99 mg/dL (ref 70–99)
Phosphorus: 2.7 mg/dL (ref 2.5–4.6)
Potassium: 4.1 mmol/L (ref 3.5–5.1)
Sodium: 138 mmol/L (ref 135–145)

## 2024-03-18 MED ORDER — HYDROCHLOROTHIAZIDE 12.5 MG PO TABS
12.5000 mg | ORAL_TABLET | Freq: Every day | ORAL | Status: DC
  Administered 2024-03-18: 12.5 mg via ORAL
  Filled 2024-03-18: qty 1

## 2024-03-18 MED ORDER — LOSARTAN POTASSIUM 50 MG PO TABS
100.0000 mg | ORAL_TABLET | Freq: Every day | ORAL | Status: DC
  Administered 2024-03-18: 100 mg via ORAL
  Filled 2024-03-18: qty 2

## 2024-03-18 MED ORDER — LOSARTAN POTASSIUM-HCTZ 100-12.5 MG PO TABS: 1.0000 | ORAL_TABLET | Freq: Every day | ORAL | Status: DC

## 2024-03-18 NOTE — TOC Progression Note (Signed)
 Transition of Care [facility]) - Progression Note    Patient Details  Name: Traci Quinn MRN: [id number] Date of Birth: 07-29-1934  Transition of Care [facility]) CM/SW Contact  Lorraine LILLETTE Fenton, LCSW Phone Number: 03/18/2024, 10:38 AM  Clinical Narrative:    CSW visited pt at bedside to offer recommended HHPT.  Pt accepted and has had this in the past.  Pt did not have choice of providers.  CSW agreed to try to match with  a provider who will reach out to her after DC.     Hedda accepted, added to AVS, Humphrey to clinical team.Pt DC today, no further IPCM needs.     Barriers to Discharge: No Barriers Identified               Expected Discharge Plan and Services         Expected Discharge Date: 03/18/24                                     Social Drivers of Health (SDOH) Interventions SDOH Screenings   Food Insecurity: No Food Insecurity (03/16/2024)  Housing: Low Risk  (03/16/2024)  Transportation Needs: No Transportation Needs (03/16/2024)  Utilities: Not At Risk (03/16/2024)  Financial Resource Strain: Low Risk  (12/05/2023)   Received from [facility]  Social Connections: Patient Declined (03/16/2024)  Tobacco Use: Low Risk  (03/16/2024)    Readmission Risk Interventions     No data to display

## 2024-03-18 NOTE — Plan of Care (Signed)

## 2024-03-18 NOTE — Progress Notes (Addendum)
 Called report to [employer], RN, informed patient that she is transferring to room 132. Belongings are pack and will send to patient. Took telemetry monitor off, and Manuelita PEAK help patient to transport.

## 2024-03-18 NOTE — Plan of Care (Signed)
  Problem: Education: Goal: Knowledge of General Education information will improve Description: Including pain rating scale, medication(s)/side effects and non-pharmacologic comfort measures 03/18/2024 0835 by Joshua Andrez PARAS, LPN Outcome: Adequate for Discharge 03/18/2024 9167 by Joshua Andrez PARAS, LPN Outcome: Adequate for Discharge   Problem: Health Behavior/Discharge Planning: Goal: Ability to manage health-related needs will improve 03/18/2024 0835 by Joshua Andrez PARAS, LPN Outcome: Adequate for Discharge 03/18/2024 9167 by Joshua Andrez PARAS, LPN Outcome: Adequate for Discharge   Problem: Clinical Measurements: Goal: Ability to maintain clinical measurements within normal limits will improve 03/18/2024 0835 by Joshua Andrez PARAS, LPN Outcome: Adequate for Discharge 03/18/2024 9167 by Joshua Andrez PARAS, LPN Outcome: Adequate for Discharge Goal: Will remain free from infection 03/18/2024 0835 by Joshua Andrez PARAS, LPN Outcome: Adequate for Discharge 03/18/2024 9167 by Joshua Andrez PARAS, LPN Outcome: Adequate for Discharge Goal: Diagnostic test results will improve 03/18/2024 0835 by Joshua Andrez PARAS, LPN Outcome: Adequate for Discharge 03/18/2024 9167 by Joshua Andrez PARAS, LPN Outcome: Adequate for Discharge Goal: Respiratory complications will improve 03/18/2024 0835 by Joshua Andrez PARAS, LPN Outcome: Adequate for Discharge 03/18/2024 9167 by Joshua Andrez PARAS, LPN Outcome: Adequate for Discharge Goal: Cardiovascular complication will be avoided 03/18/2024 0835 by Joshua Andrez PARAS, LPN Outcome: Adequate for Discharge 03/18/2024 9167 by Joshua Andrez PARAS, LPN Outcome: Adequate for Discharge   Problem: Activity: Goal: Risk for activity intolerance will decrease 03/18/2024 0835 by Joshua Andrez PARAS, LPN Outcome: Adequate for Discharge 03/18/2024 9167 by Joshua Andrez PARAS, LPN Outcome: Adequate for Discharge   Problem: Nutrition: Goal: Adequate nutrition will be maintained 03/18/2024 0835 by Joshua Andrez PARAS, LPN Outcome:  Adequate for Discharge 03/18/2024 0832 by Joshua Andrez PARAS, LPN Outcome: Adequate for Discharge   Problem: Coping: Goal: Level of anxiety will decrease 03/18/2024 0835 by Joshua Andrez PARAS, LPN Outcome: Adequate for Discharge 03/18/2024 9167 by Joshua Andrez PARAS, LPN Outcome: Adequate for Discharge   Problem: Elimination: Goal: Will not experience complications related to bowel motility 03/18/2024 0835 by Joshua Andrez PARAS, LPN Outcome: Adequate for Discharge 03/18/2024 9167 by Joshua Andrez PARAS, LPN Outcome: Adequate for Discharge Goal: Will not experience complications related to urinary retention 03/18/2024 0835 by Joshua Andrez PARAS, LPN Outcome: Adequate for Discharge 03/18/2024 9167 by Joshua Andrez PARAS, LPN Outcome: Adequate for Discharge   Problem: Pain Managment: Goal: General experience of comfort will improve and/or be controlled 03/18/2024 0835 by Joshua Andrez PARAS, LPN Outcome: Adequate for Discharge 03/18/2024 9167 by Joshua Andrez PARAS, LPN Outcome: Adequate for Discharge   Problem: Safety: Goal: Ability to remain free from injury will improve 03/18/2024 0835 by Joshua Andrez PARAS, LPN Outcome: Adequate for Discharge 03/18/2024 9167 by Joshua Andrez PARAS, LPN Outcome: Adequate for Discharge   Problem: Skin Integrity: Goal: Risk for impaired skin integrity will decrease 03/18/2024 0835 by Joshua Andrez PARAS, LPN Outcome: Adequate for Discharge 03/18/2024 0832 by Joshua Andrez PARAS, LPN Outcome: Adequate for Discharge

## 2024-03-18 NOTE — Discharge Summary (Signed)
 Physician Discharge Summary   Patient: Traci Quinn MRN: [id number] DOB: 1933-10-22  Admit date:     03/16/2024  Discharge date: 03/18/24  Discharge Physician: Amaryllis Dare   PCP: Lenon Layman ORN, MD   Recommendations at discharge:  Please obtain CBC and BMP and follow-up Please encourage hydration We discontinued home propranolol for concern of symptomatic bradycardia. Follow-up with primary care provider within a week  Discharge Diagnoses: Principal Problem:   Symptomatic bradycardia Active Problems:   AKI (acute kidney injury) (HCC)   CKD stage 3a, GFR 45-59 ml/min (HCC)   Essential hypertension, benign   Combined hyperlipidemia   GERD (gastroesophageal reflux disease)   PA (pernicious anemia)   Raynaud's phenomenon without gangrene   Hospital Course: [name] is an 89 year old female with history of DVT, primary hypertension, hyperlipidemia, Raynaud's phenomenon without gangrene, who presents ED for chief concerns of 4 weeks of fatigue.  At baseline patient does not drink enough water, only drinks some soda during the day and coffee in the morning.  She was also having some increased urinary frequency without any dysuria over the past 2 to 3 days.  Vitals in the ED showed T 98, respiration rate 16, heart rate 41, blood pressure 154/67, SpO2 98% on room air.  Serum sodium is 138, potassium 4.1, chloride 102, bicarb 24, BUN of 39, serum creatinine of 2.02, EGFR 23, nonfasting glucose 118, WBC 8.6, hemoglobin 11.1, platelets of 244. Magnesium 2.0, Initial HS troponin 16.  TSH is 6.539.  Free T4 is 0.88.  Phosphorus 5.3.   Patient received 1 L of bolus in ED and was admitted for concern of symptomatic bradycardia likely secondary to propranolol use with AKI.  7/26: Vitals with heart rate of 61 and blood pressure elevated at 177/64, labs with hemoglobin decreased to 9.5 but also line decreased, might be concentrated sample on admission, creatinine improved  to 1.32, baseline seems to be around 1, UA was not consistent with UTI. Starting on amlodipine  as home HCTZ's-losartan  is being held for concern of AKI. PT is recommending home health-ordered  7/27: Remained hemodynamically stable.  Renal functions back to baseline, restarting home Hyzaar and home propranolol was discontinued as heart rate remained in 60s.  Patient was instructed to keep herself well-hydrated, continue taking current medications and follow-up closely with primary care provider and they can add another agent for hypertension if needed.  Assessment and Plan: * Symptomatic bradycardia Suspect secondary to continued propranolol use in setting of acute kidney injury Heart rate improved today Hold home propranolol-will discontinue on discharge  AKI (acute kidney injury) (HCC) Baseline CKD 3A Creatinine improved to baseline with IV fluid, likely due to dehydration as she was not taking enough p.o. fluid. Restarting home Hyzaar on discharge  Essential hypertension, benign Blood pressure elevated this morning. Patient was on HCTZ separately along with Hyzaar and propranolol at home.  Home HCTZ was discontinued due to duplication. Holding home his ARB and propranolol due to AKI and concern of symptomatic bradycardia. - Starting on amlodipine  Hydralazine  5 mg IV every 6 hours as needed for SBP greater 180, 5 days ordered   Combined hyperlipidemia Home pravastatin  20 mg every evening resumed  Consultants: None Procedures performed: None Disposition: Home health Diet recommendation:  Discharge Diet Orders (From admission, onward)     Start     Ordered   03/18/24 0000  Diet - low sodium heart healthy        03/18/24 1004  Regular diet DISCHARGE MEDICATION: Allergies as of 03/18/2024       Reactions   Atorvastatin    Other reaction(s): Muscle Pain   Celecoxib    Other reaction(s): Other (See Comments) GI bleed   Indomethacin Nausea And Vomiting    Other reaction(s): Headache   Zetia [ezetimibe]         Medication List     STOP taking these medications    budesonide-formoterol 160-4.5 MCG/ACT inhaler Commonly known as: SYMBICORT   propranolol ER 120 MG 24 hr capsule Commonly known as: INDERAL LA       TAKE these medications    amitriptyline 25 MG tablet Commonly known as: ELAVIL Take 25 mg by mouth at bedtime.   aspirin  81 MG tablet Take 81 mg by mouth daily.   fluticasone  27.5 MCG/SPRAY nasal spray Commonly known as: VERAMYST Place 2 sprays into the nose as needed for rhinitis.   losartan -hydrochlorothiazide  100-12.5 MG tablet Commonly known as: HYZAAR Take 1 tablet by mouth daily.   lovastatin 20 MG tablet Commonly known as: MEVACOR Take 20 mg by mouth at bedtime.        Follow-up Information     Lenon Layman ORN, MD. Schedule an appointment as soon as possible for a visit in 1 week(s).   Specialty: Internal Medicine Contact information: [address] [facility] [place] [address] [phone]                Discharge Exam: Fredricka Weights   03/16/24 1156  Weight: 59 kg   General.  Frail elderly lady, in no acute distress. Pulmonary.  Lungs clear bilaterally, normal respiratory effort. CV.  Regular rate and rhythm, no JVD, rub or murmur. Abdomen.  Soft, nontender, nondistended, BS positive. CNS.  Alert and oriented .  No focal neurologic deficit. Extremities.  No edema, no cyanosis, pulses intact and symmetrical. Psychiatry.  Judgment and insight appears normal.   Condition at discharge: stable  The results of significant diagnostics from this hospitalization (including imaging, microbiology, ancillary and laboratory) are listed below for reference.   Imaging Studies: DG Chest Port 1 View Result Date: 03/16/2024 CLINICAL DATA:  Bradycardia. EXAM: PORTABLE CHEST 1 VIEW COMPARISON:  None Available. FINDINGS: No focal consolidation, pleural effusion or  pneumothorax. The cardiac silhouette is within normal limits. Atherosclerotic calcification of the aorta. No acute osseous pathology. Scoliosis. IMPRESSION: No active disease. Electronically Signed   By: Vanetta Chou M.D.   On: 03/16/2024 12:37    Microbiology: Results for orders placed or performed in visit on 08/03/22  Calprotectin, Fecal     Status: None   Collection Time: 08/09/22  1:30 PM   Specimen: Stool  Result Value Ref Range Status   Calprotectin, Fecal 42 0 - 120 ug/g Final    Comment: Concentration     Interpretation   Follow-Up < 5 - 50 ug/g     Normal           None >50 -120 ug/g     Borderline       Re-evaluate in 4-6 weeks     >120 ug/g     Abnormal         Repeat as clinically                                    indicated   C difficile Toxins A+B W/Rflx     Status:  None   Collection Time: 08/09/22  1:30 PM   ST  Result Value Ref Range Status   C difficile Toxins A+B, EIA Negative Negative Final  C difficile, Cytotoxin B     Status: None   Collection Time: 08/09/22  1:30 PM   ST  Result Value Ref Range Status   C diff Toxin B Final report  Final    Comment:                             Reference Range: Negative   Result 1 Comment  Final    Comment: Negative No cytotoxin detected.     Labs: CBC: Recent Labs  Lab 03/16/24 1208 03/17/24 0428  WBC 8.6 5.8  HGB 11.1* 9.5*  HCT 34.4* 29.5*  MCV 94.8 93.4  PLT 244 200   Basic Metabolic Panel: Recent Labs  Lab 03/16/24 1208 03/17/24 0428 03/18/24 0425  NA 138 140 138  K 4.1 3.8 4.1  CL 102 109 108  CO2 24 24 24   GLUCOSE 118* 100* 99  BUN 39* 30* 22  CREATININE 2.02* 1.32* 0.96  CALCIUM 8.5* 8.4* 8.7*  MG 2.0  --   --   PHOS 5.3*  --  2.7   Liver Function Tests: Recent Labs  Lab 03/18/24 0425  ALBUMIN 3.2*   CBG: No results for input(s): GLUCAP in the last 168 hours.  Discharge time spent: greater than 30 minutes.  This record has been created using Conservation officer, historic buildings.  Errors have been sought and corrected,but may not always be located. Such creation errors do not reflect on the standard of care.   Signed: Amaryllis Dare, MD [facility] 03/18/2024

## 2024-04-12 ENCOUNTER — Emergency Department

## 2024-04-12 ENCOUNTER — Other Ambulatory Visit: Payer: Self-pay

## 2024-04-12 ENCOUNTER — Emergency Department
Admission: EM | Admit: 2024-04-12 | Discharge: 2024-04-12 | Disposition: A | Discharge status: Home / Self Care | Attending: Emergency Medicine | Admitting: Emergency Medicine

## 2024-04-12 DIAGNOSIS — R0602 Shortness of breath: Secondary | ICD-10-CM | POA: Diagnosis not present

## 2024-04-12 DIAGNOSIS — D649 Anemia, unspecified: Secondary | ICD-10-CM | POA: Insufficient documentation

## 2024-04-12 DIAGNOSIS — N189 Chronic kidney disease, unspecified: Secondary | ICD-10-CM | POA: Diagnosis not present

## 2024-04-12 DIAGNOSIS — I129 Hypertensive chronic kidney disease with stage 1 through stage 4 chronic kidney disease, or unspecified chronic kidney disease: Secondary | ICD-10-CM | POA: Diagnosis not present

## 2024-04-12 DIAGNOSIS — N179 Acute kidney failure, unspecified: Secondary | ICD-10-CM | POA: Diagnosis not present

## 2024-04-12 DIAGNOSIS — R002 Palpitations: Secondary | ICD-10-CM

## 2024-04-12 DIAGNOSIS — I10 Essential (primary) hypertension: Secondary | ICD-10-CM

## 2024-04-12 LAB — COMPREHENSIVE METABOLIC PANEL WITH GFR
ALT: 21 U/L (ref 0–44)
AST: 23 U/L (ref 15–41)
Albumin: 4.2 g/dL (ref 3.5–5.0)
Alkaline Phosphatase: 65 U/L (ref 38–126)
Anion gap: 15 (ref 5–15)
BUN: 37 mg/dL — ABNORMAL HIGH (ref 8–23)
CO2: 24 mmol/L (ref 22–32)
Calcium: 9.3 mg/dL (ref 8.9–10.3)
Chloride: 96 mmol/L — ABNORMAL LOW (ref 98–111)
Creatinine, Ser: 1.37 mg/dL — ABNORMAL HIGH (ref 0.44–1.00)
GFR, Estimated: 37 mL/min — ABNORMAL LOW (ref >60)
Glucose, Bld: 101 mg/dL — ABNORMAL HIGH (ref 70–99)
Potassium: 3.3 mmol/L — ABNORMAL LOW (ref 3.5–5.1)
Sodium: 135 mmol/L (ref 135–145)
Total Bilirubin: 0.6 mg/dL (ref 0.0–1.2)
Total Protein: 7.4 g/dL (ref 6.5–8.1)

## 2024-04-12 LAB — BRAIN NATRIURETIC PEPTIDE: B Natriuretic Peptide: 75.6 pg/mL (ref 0.0–100.0)

## 2024-04-12 LAB — CBC WITH DIFFERENTIAL/PLATELET
Abs Immature Granulocytes: 0.02 K/uL (ref 0.00–0.07)
Basophils Absolute: 0.1 K/uL (ref 0.0–0.1)
Basophils Relative: 1 %
Eosinophils Absolute: 0.7 K/uL — ABNORMAL HIGH (ref 0.0–0.5)
Eosinophils Relative: 10 %
HCT: 32.9 % — ABNORMAL LOW (ref 36.0–46.0)
Hemoglobin: 11.1 g/dL — ABNORMAL LOW (ref 12.0–15.0)
Immature Granulocytes: 0 %
Lymphocytes Relative: 20 %
Lymphs Abs: 1.4 K/uL (ref 0.7–4.0)
MCH: 30.4 pg (ref 26.0–34.0)
MCHC: 33.7 g/dL (ref 30.0–36.0)
MCV: 90.1 fL (ref 80.0–100.0)
Monocytes Absolute: 0.5 K/uL (ref 0.1–1.0)
Monocytes Relative: 7 %
Neutro Abs: 4.4 K/uL (ref 1.7–7.7)
Neutrophils Relative %: 62 %
Platelets: 248 K/uL (ref 150–400)
RBC: 3.65 MIL/uL — ABNORMAL LOW (ref 3.87–5.11)
RDW: 12.5 % (ref 11.5–15.5)
WBC: 7 K/uL (ref 4.0–10.5)
nRBC: 0 % (ref 0.0–0.2)

## 2024-04-12 LAB — TYPE AND SCREEN
ABO/RH(D): A POS
Antibody Screen: NEGATIVE

## 2024-04-12 LAB — TROPONIN I (HIGH SENSITIVITY): Troponin I (High Sensitivity): 17 ng/L (ref <18)

## 2024-04-12 MED ORDER — NIFEDIPINE ER OSMOTIC RELEASE 30 MG PO TB24
30.0000 mg | ORAL_TABLET | Freq: Once | ORAL | Status: AC
  Administered 2024-04-12: 30 mg via ORAL
  Filled 2024-04-12: qty 1

## 2024-04-12 MED ORDER — NIFEDIPINE ER OSMOTIC RELEASE 30 MG PO TB24: 30.0000 mg | ORAL_TABLET | Freq: Every day | ORAL | 2 refills | Status: AC

## 2024-04-12 NOTE — ED Provider Notes (Signed)
 [facility] Provider Note   Event Date/Time   First MD Initiated Contact with Patient 04/12/24 1939     (approximate) History  Palpitations  HPI Traci Quinn is a 89 y.o. female with a past medical history of hypertension, GERD, CKD, migraines, and AAA who presents complaining of palpitations and her heart racing after being evaluated by EMS during a traffic stop today.  Patient states that this racing heart has somewhat improved throughout emergency transport.  Patient was given 500 cc of NS.  Patient endorses mild associated shortness of breath ROS: Patient currently denies any vision changes, tinnitus, difficulty speaking, facial droop, sore throat, abdominal pain, nausea/vomiting/diarrhea, dysuria, or weakness/numbness/paresthesias in any extremity   Physical Exam  Triage Vital Signs: ED Triage Vitals  Encounter Vitals Group     BP      Girls Systolic BP Percentile      Girls Diastolic BP Percentile      Boys Systolic BP Percentile      Boys Diastolic BP Percentile      Pulse      Resp      Temp      Temp src      SpO2      Weight      Height      Head Circumference      Peak Flow      Pain Score      Pain Loc      Pain Education      Exclude from Growth Chart    Most recent vital signs: Vitals:   04/12/24 2030 04/12/24 2145  BP: (!) 183/89 (!) 195/81  Pulse: 83 92  Resp: 15 17  Temp:    SpO2: 99% 98%   General: Awake, oriented x4. CV:  Good peripheral perfusion. Resp:  Normal effort. Abd:  No distention. Other:  Elderly overweight Caucasian female resting comfortably in no acute distress ED Results / Procedures / Treatments  Labs (all labs ordered are listed, but only abnormal results are displayed) Labs Reviewed  COMPREHENSIVE METABOLIC PANEL WITH GFR - Abnormal; Notable for the following components:      Result Value   Potassium 3.3 (*)    Chloride 96 (*)    Glucose, Bld 101 (*)    BUN 37 (*)    Creatinine, Ser 1.37 (*)     GFR, Estimated 37 (*)    All other components within normal limits  CBC WITH DIFFERENTIAL/PLATELET - Abnormal; Notable for the following components:   RBC 3.65 (*)    Hemoglobin 11.1 (*)    HCT 32.9 (*)    Eosinophils Absolute 0.7 (*)    All other components within normal limits  BRAIN NATRIURETIC PEPTIDE  TYPE AND SCREEN  TYPE AND SCREEN  TROPONIN I (HIGH SENSITIVITY)   EKG ED ECG REPORT I, Artist MARLA Kerns, the attending physician, personally viewed and interpreted this ECG. Date: 04/12/2024 EKG Time: 2006 Rate: 84 Rhythm: normal sinus rhythm QRS Axis: normal Intervals: normal ST/T Wave abnormalities: normal Narrative Interpretation: no evidence of acute ischemia RADIOLOGY ED MD interpretation: Single view portable chest x-ray shows no evidence of active disease - All radiology independently interpreted and agree with radiology assessment Official radiology report(s): DG Chest Port 1 View Result Date: 04/12/2024 CLINICAL DATA:  Chest discomfort, shortness of breath EXAM: PORTABLE CHEST 1 VIEW COMPARISON:  03/16/2024 FINDINGS: Heart and mediastinal contours are within normal limits. No focal opacities or effusions. No acute bony abnormality. Tortuous, calcified  aorta. IMPRESSION: No active disease. Electronically Signed   By: Franky Crease M.D.   On: 04/12/2024 20:33   PROCEDURES: Critical Care performed: No Procedures MEDICATIONS ORDERED IN ED: Medications  NIFEdipine  (PROCARDIA -XL/NIFEDICAL-XL) 24 hr tablet 30 mg (has no administration in time range)   IMPRESSION / MDM / ASSESSMENT AND PLAN / ED COURSE  I reviewed the triage vital signs and the nursing notes.                             The patient is on the cardiac monitor to evaluate for evidence of arrhythmia and/or significant heart rate changes. Patient's presentation is most consistent with acute presentation with potential threat to life or bodily function. 89 year old female with the above-stated past medical  history presents via EMS for racing heart rate.  Patient states that this sensation has improved with 500 cc of saline. DDx: Atrial fibrillation, SVT, ACS, anemia Plan: CBC, CMP, troponin, BNP, type and screen, EKG, chest x-ray Clinical Course as of 04/12/24 2216  Thu Apr 12, 2024  2215 Reassessed patient as well as went over lab results with her.  Patient shows stable anemia at 11 as well as stable AKI at 1.3.  Patient has slightly low potassium at 3.3.  Patient is also showing elevating blood pressures despite taking all of her home medications.  Patient states that she does not take any medications twice a day.  Given her blood pressure currently is at 195/81, will treat with nifedipine  here as well as a prescription for nifedipine  in the outpatient setting prior to following up with her primary care physician, Dr. Lenon.  Patient expresses understanding as well as given strict return precautions.  All questions were answered prior to discharge  Dispo: Discharge home with PCP follow-up [EB]    Clinical Course User Index [EB] Jossie Artist POUR, MD   FINAL CLINICAL IMPRESSION(S) / ED DIAGNOSES   Final diagnoses:  Palpitations  Uncontrolled hypertension   Rx / DC Orders   ED Discharge Orders          Ordered    NIFEdipine  (PROCARDIA -XL/NIFEDICAL-XL) 30 MG 24 hr tablet  Daily        04/12/24 2215           Note:  This document was prepared using Dragon voice recognition software and may include unintentional dictation errors.   Jossie Artist POUR, MD 04/12/24 2216

## 2024-04-12 NOTE — ED Triage Notes (Signed)
 Patient to ED via [facility] for heart palpitations. Patient was riding home in the passenger seat when she felt her heart start to flutter and felt like it was beating out of her chest. With EMS, patient was tachy and had hypertension. Patient was given 500 mL of fluids which brought her heart rate down slightly. Patient denies any dizziness or SOB. Patient states she just feels weak. Patient is A&Ox4.
